# Patient Record
Sex: Female | Born: 1980 | Hispanic: Yes | Marital: Married | Smoking: Current some day smoker
Health system: Southern US, Community
[De-identification: ages and names within clinical notes are randomized; demographics above are authoritative.]

## PROBLEM LIST (undated history)

## (undated) DIAGNOSIS — Z3183 Encounter for assisted reproductive fertility procedure cycle: Secondary | ICD-10-CM

## (undated) DIAGNOSIS — N809 Endometriosis, unspecified: Secondary | ICD-10-CM

## (undated) DIAGNOSIS — N6310 Unspecified lump in the right breast, unspecified quadrant: Secondary | ICD-10-CM

---

## 2018-09-21 ENCOUNTER — Emergency Department (HOSPITAL_COMMUNITY): Payer: Self-pay

## 2018-09-21 ENCOUNTER — Other Ambulatory Visit: Payer: Self-pay

## 2018-09-21 ENCOUNTER — Emergency Department (HOSPITAL_COMMUNITY)
Admission: EM | Admit: 2018-09-21 | Discharge: 2018-09-21 | Disposition: A | Discharge status: Home / Self Care | Payer: Self-pay | Attending: Emergency Medicine | Admitting: Emergency Medicine

## 2018-09-21 DIAGNOSIS — R110 Nausea: Secondary | ICD-10-CM | POA: Insufficient documentation

## 2018-09-21 DIAGNOSIS — Z3A01 Less than 8 weeks gestation of pregnancy: Secondary | ICD-10-CM | POA: Insufficient documentation

## 2018-09-21 DIAGNOSIS — R1030 Lower abdominal pain, unspecified: Secondary | ICD-10-CM | POA: Insufficient documentation

## 2018-09-21 DIAGNOSIS — O209 Hemorrhage in early pregnancy, unspecified: Secondary | ICD-10-CM

## 2018-09-21 DIAGNOSIS — O468X1 Other antepartum hemorrhage, first trimester: Secondary | ICD-10-CM | POA: Insufficient documentation

## 2018-09-21 LAB — CBC WITH DIFFERENTIAL/PLATELET
Abs Immature Granulocytes: 0.04 10*3/uL (ref 0.00–0.07)
BASOS ABS: 0.1 10*3/uL (ref 0.0–0.1)
Basophils Relative: 1 %
Eosinophils Absolute: 0.2 10*3/uL (ref 0.0–0.5)
Eosinophils Relative: 2 %
HCT: 38.3 % (ref 36.0–46.0)
HEMOGLOBIN: 12 g/dL (ref 12.0–15.0)
IMMATURE GRANULOCYTES: 0 %
LYMPHS ABS: 2.6 10*3/uL (ref 0.7–4.0)
LYMPHS PCT: 28 %
MCH: 27.2 pg (ref 26.0–34.0)
MCHC: 31.3 g/dL (ref 30.0–36.0)
MCV: 86.8 fL (ref 80.0–100.0)
Monocytes Absolute: 0.6 10*3/uL (ref 0.1–1.0)
Monocytes Relative: 6 %
NEUTROS PCT: 63 %
NRBC: 0 % (ref 0.0–0.2)
Neutro Abs: 5.9 10*3/uL (ref 1.7–7.7)
Platelets: 323 10*3/uL (ref 150–400)
RBC: 4.41 MIL/uL (ref 3.87–5.11)
RDW: 12.6 % (ref 11.5–15.5)
WBC: 9.4 10*3/uL (ref 4.0–10.5)

## 2018-09-21 LAB — ABO/RH: ABO/RH(D): B POS

## 2018-09-21 LAB — HCG, QUANTITATIVE, PREGNANCY: HCG, BETA CHAIN, QUANT, S: 4 m[IU]/mL (ref <5)

## 2018-09-21 LAB — WET PREP, GENITAL
Clue Cells Wet Prep HPF POC: NONE SEEN
Sperm: NONE SEEN
TRICH WET PREP: NONE SEEN
Yeast Wet Prep HPF POC: NONE SEEN

## 2018-09-21 NOTE — ED Provider Notes (Signed)
[facility]-EMERGENCY DEPT Provider Note   CSN: [id number] Arrival date & time: 09/21/18  1924     History   Chief Complaint Chief Complaint  Patient presents with  . Vaginal Bleeding  . Abdominal Pain    HPI Judith Levine Synetta Fail is a 37 y.o. female G1 who present to the ED with vaginal bleeding in early pregnancy. LMP 08/04/18. Patient reports lying in bed this afternoon when the bleeding started. Last sexual intercourse one week ago. Patient had positive pregnancy test at her PCP and has not started prenatal care yet.  The history is provided by the patient. A language interpreter was used.  Vaginal Bleeding  Primary symptoms include vaginal bleeding.  Primary symptoms include no dysuria. There has been no fever. This is a new problem. The symptoms occur during urination. She is pregnant. Associated symptoms include abdominal pain and nausea. Pertinent negatives include no vomiting and no frequency. She has tried nothing for the symptoms.  Abdominal Pain   Associated symptoms include nausea. Pertinent negatives include fever, vomiting, dysuria, frequency and headaches.    No past medical history on file.  There are no active problems to display for this patient.    OB History   None      Home Medications    Prior to Admission medications   Not on File    Family History No family history on file.  Social History Social History   Tobacco Use  . Smoking status: Not on file  Substance Use Topics  . Alcohol use: Not on file  . Drug use: Not on file     Allergies   Patient has no allergy information on record.   Review of Systems Review of Systems  Constitutional: Negative for chills and fever.  HENT: Negative.   Eyes: Negative for visual disturbance.  Gastrointestinal: Positive for abdominal pain and nausea. Negative for vomiting.  Genitourinary: Positive for vaginal bleeding. Negative for dysuria and frequency.  Musculoskeletal:  Negative for back pain.  Skin: Negative for rash.  Neurological: Negative for headaches.     Physical Exam Updated Vital Signs BP 136/70 (BP Location: Right Arm)   Pulse 79   Temp (!) 97.5 F (36.4 C) (Oral)   Resp 19   Ht 5\' 3"  (1.6 m)   Wt 49.9 kg   LMP 08/04/2018   SpO2 99%   BMI 19.49 kg/m   Physical Exam  Constitutional: She appears well-developed and well-nourished. No distress.  HENT:  Head: Normocephalic.  Eyes: Conjunctivae and EOM are normal.  Neck: Neck supple.  Cardiovascular: Normal rate.  Pulmonary/Chest: Effort normal.  Abdominal: Soft. There is tenderness (mild lower abdomen).  Genitourinary:  Genitourinary Comments: External genitalia without lesions. Small blood vaginal vault. Cervix long, closed. No CMT, no adnexal tenderness or mass palpated. Uterus without enlargement.   Musculoskeletal: Normal range of motion.  Neurological: She is alert.  Skin: Skin is warm and dry.  Psychiatric: She has a normal mood and affect. Her behavior is normal.  Nursing note and vitals reviewed.    ED Treatments / Results  Labs (all labs ordered are listed, but only abnormal results are displayed) Labs Reviewed  WET PREP, GENITAL - Abnormal; Notable for the following components:      Result Value   WBC, Wet Prep HPF POC MODERATE (*)    All other components within normal limits  CBC WITH DIFFERENTIAL/PLATELET  HCG, QUANTITATIVE, PREGNANCY  ABO/RH  GC/CHLAMYDIA PROBE AMP (Aline) NOT AT [facility]  Radiology US Ob Less Than 14 Weeks With Ob Transvaginal  Result Date: 09/21/2018 CLINICAL DATA:  Pregnant patient with vaginal spotting and abdominal pain. Last menstrual period 08/04/2018. Beta HCG in progress. Clinical notes state patient reports 09/17/2018 diagnosed pregnancy of undetermined location. EXAM: OBSTETRIC <14 WK [place] AND TRANSVAGINAL OB US TECHNIQUE: Both transabdominal and transvaginal ultrasound examinations were performed for complete evaluation of the  gestation as well as the maternal uterus, adnexal regions, and pelvic cul-de-sac. Transvaginal technique was performed to assess early pregnancy. COMPARISON:  None. FINDINGS: Intrauterine gestational sac: None Yolk sac:  Not Visualized. Embryo:  Not Visualized. Cardiac Activity: Not Visualized. Subchorionic hemorrhage:  Not applicable. Maternal uterus/adnexae: The uterus is anteverted. Endometrium measures approximately 5 mm. No fluid in the endometrial canal. The right ovary measures 3.8 x 2.0 x 2.5 cm and contains a 2.4 x 1.5 x 2.1 cm ovoid heterogeneous complex region without vascularity. No ring of fire or the left ovary is normal. Small volume of simple appearing free fluid in the pelvis and right adnexa. IMPRESSION: 1. No intrauterine pregnancy. 2. Ovoid heterogeneous 2.4 cm avascular structure in the right ovary is nonspecific, and may represent a hemorrhagic cyst. Right ovarian ectopic is also considered given free fluid in the adnexa. Free fluid appears simple rather than complex or hemorrhagic. Recommend correlation with beta HCG. Trending of beta HCG and follow-up ultrasound in 7-10 days recommended. Correlation with prior ultrasound (presumably obtained elsewhere) may be of value. Electronically Signed   By: Narda Rutherford M.D.   On: 09/21/2018 22:01    Procedures Procedures (including critical care time)  Medications Ordered in ED Medications - No data to display   Initial Impression / Assessment and Plan / ED Course  I have reviewed the triage vital signs and the nursing notes. 37 y.o. female here with vaginal bleeding after positive pregnancy test at a clinic one week ago and LMP 08/04/18. Patient stable for d/c without hemorrhage, empty uterus, no fever or signs of infection. Discussed with the patient possible miscarriage vs false positive pregnancy test last week at clinic, vs chemical pregnancy and need for f/u at [facility] Out patient clinic. Patient voices understanding and agrees with  plan. I discussed this case with the CNM at [facility] and with Dr. Rush Landmark.  Final Clinical Impressions(s) / ED Diagnoses   Final diagnoses:  Vaginal bleeding in pregnancy, first trimester    ED Discharge Orders    None       Kerrie [place], NP 09/21/18 2304    Tegeler, Canary Brim, MD 09/21/18 [phone]

## 2018-09-21 NOTE — ED Triage Notes (Signed)
Pt reports that 09/17/2018 pt was dx with pregnancy undetermined gestation; LMP 08/04/2018. Pt states that sudden onset of pain 1700 and spotting. Pt denies N/V.

## 2018-09-21 NOTE — Discharge Instructions (Addendum)
Follow up at [facility] for any problems.

## 2018-09-23 ENCOUNTER — Telehealth: Payer: Self-pay | Admitting: *Deleted

## 2018-09-23 LAB — GC/CHLAMYDIA PROBE AMP (~~LOC~~) NOT AT ARMC
Chlamydia: NEGATIVE
NEISSERIA GONORRHEA: NEGATIVE

## 2018-09-23 NOTE — Telephone Encounter (Signed)
A female identifying himself as interpreter left message stating patient has miscarriage Saturday night and wants to know what next steps are because she is still bleeding .  I called patient with Interpeter Nile Riggs and she reports she is still bleeding like a period, denies pain , that she is tired. States is first miscarriage and was told should be seen in our office.  I explained I will have front office call her with appointment . In the meantime if she has severe pain or heavy bleeding or feeling dizzy, lightheaded come to [facility] , not [facility] ED She vocies understanding.

## 2018-09-25 ENCOUNTER — Telehealth: Payer: Self-pay | Admitting: Family Medicine

## 2018-09-25 NOTE — Telephone Encounter (Signed)
I tried to reach the patient via telephone. However, I was not able to get the call to go through. I will send a reminder letter.

## 2018-10-11 ENCOUNTER — Encounter: Payer: Self-pay | Admitting: Student

## 2019-09-26 ENCOUNTER — Other Ambulatory Visit: Payer: Self-pay

## 2019-09-26 DIAGNOSIS — Z20822 Contact with and (suspected) exposure to covid-19: Secondary | ICD-10-CM

## 2019-09-27 LAB — NOVEL CORONAVIRUS, NAA: SARS-CoV-2, NAA: NOT DETECTED

## 2020-01-06 ENCOUNTER — Other Ambulatory Visit: Payer: Self-pay | Admitting: Obstetrics and Gynecology

## 2020-01-06 DIAGNOSIS — N6310 Unspecified lump in the right breast, unspecified quadrant: Secondary | ICD-10-CM

## 2020-01-16 ENCOUNTER — Ambulatory Visit
Admission: RE | Admit: 2020-01-16 | Discharge: 2020-01-16 | Disposition: A | Discharge status: Home / Self Care | Payer: BC Managed Care – PPO | Source: Ambulatory Visit | Attending: Obstetrics and Gynecology | Admitting: Obstetrics and Gynecology

## 2020-01-16 ENCOUNTER — Other Ambulatory Visit: Payer: Self-pay

## 2020-01-16 DIAGNOSIS — N6310 Unspecified lump in the right breast, unspecified quadrant: Secondary | ICD-10-CM

## 2020-08-17 ENCOUNTER — Emergency Department (HOSPITAL_BASED_OUTPATIENT_CLINIC_OR_DEPARTMENT_OTHER)
Admission: EM | Admit: 2020-08-17 | Discharge: 2020-08-17 | Disposition: A | Discharge status: Home / Self Care | Payer: BC Managed Care – PPO | Attending: Emergency Medicine | Admitting: Emergency Medicine

## 2020-08-17 ENCOUNTER — Other Ambulatory Visit: Payer: Self-pay

## 2020-08-17 ENCOUNTER — Emergency Department (HOSPITAL_BASED_OUTPATIENT_CLINIC_OR_DEPARTMENT_OTHER): Payer: BC Managed Care – PPO

## 2020-08-17 ENCOUNTER — Encounter (HOSPITAL_BASED_OUTPATIENT_CLINIC_OR_DEPARTMENT_OTHER): Payer: Self-pay

## 2020-08-17 DIAGNOSIS — R1010 Upper abdominal pain, unspecified: Secondary | ICD-10-CM | POA: Insufficient documentation

## 2020-08-17 DIAGNOSIS — Z79899 Other long term (current) drug therapy: Secondary | ICD-10-CM | POA: Insufficient documentation

## 2020-08-17 DIAGNOSIS — R10816 Epigastric abdominal tenderness: Secondary | ICD-10-CM | POA: Insufficient documentation

## 2020-08-17 DIAGNOSIS — R748 Abnormal levels of other serum enzymes: Secondary | ICD-10-CM | POA: Diagnosis not present

## 2020-08-17 DIAGNOSIS — F17200 Nicotine dependence, unspecified, uncomplicated: Secondary | ICD-10-CM | POA: Diagnosis not present

## 2020-08-17 LAB — URINALYSIS, ROUTINE W REFLEX MICROSCOPIC
Bilirubin Urine: NEGATIVE
Glucose, UA: NEGATIVE mg/dL
Hgb urine dipstick: NEGATIVE
Ketones, ur: NEGATIVE mg/dL
Leukocytes,Ua: NEGATIVE
Nitrite: NEGATIVE
Protein, ur: NEGATIVE mg/dL
Specific Gravity, Urine: <1.005 — ABNORMAL LOW (ref 1.005–1.030)
pH: 6.5 (ref 5.0–8.0)

## 2020-08-17 LAB — CBC
HCT: 35.3 % — ABNORMAL LOW (ref 36.0–46.0)
Hemoglobin: 11.5 g/dL — ABNORMAL LOW (ref 12.0–15.0)
MCH: 26.9 pg (ref 26.0–34.0)
MCHC: 32.6 g/dL (ref 30.0–36.0)
MCV: 82.7 fL (ref 80.0–100.0)
Platelets: 330 10*3/uL (ref 150–400)
RBC: 4.27 MIL/uL (ref 3.87–5.11)
RDW: 12.1 % (ref 11.5–15.5)
WBC: 7.2 10*3/uL (ref 4.0–10.5)
nRBC: 0 % (ref 0.0–0.2)

## 2020-08-17 LAB — COMPREHENSIVE METABOLIC PANEL
ALT: 19 U/L (ref 0–44)
AST: 25 U/L (ref 15–41)
Albumin: 4.4 g/dL (ref 3.5–5.0)
Alkaline Phosphatase: 48 U/L (ref 38–126)
Anion gap: 11 (ref 5–15)
BUN: 10 mg/dL (ref 6–20)
CO2: 23 mmol/L (ref 22–32)
Calcium: 9 mg/dL (ref 8.9–10.3)
Chloride: 101 mmol/L (ref 98–111)
Creatinine, Ser: 0.53 mg/dL (ref 0.44–1.00)
GFR calc Af Amer: >60 mL/min (ref >60)
GFR calc non Af Amer: >60 mL/min (ref >60)
Glucose, Bld: 91 mg/dL (ref 70–99)
Potassium: 3.7 mmol/L (ref 3.5–5.1)
Sodium: 135 mmol/L (ref 135–145)
Total Bilirubin: 1.8 mg/dL — ABNORMAL HIGH (ref 0.3–1.2)
Total Protein: 7.2 g/dL (ref 6.5–8.1)

## 2020-08-17 LAB — PREGNANCY, URINE: Preg Test, Ur: NEGATIVE

## 2020-08-17 LAB — LIPASE, BLOOD: Lipase: 55 U/L — ABNORMAL HIGH (ref 11–51)

## 2020-08-17 MED ORDER — ONDANSETRON 4 MG PO TBDP: 4.0000 mg | ORAL_TABLET | ORAL | 0 refills | Status: DC | PRN

## 2020-08-17 MED ORDER — LACTATED RINGERS IV BOLUS
1000.0000 mL | Freq: Once | INTRAVENOUS | Status: AC
  Administered 2020-08-17: 1000 mL via INTRAVENOUS

## 2020-08-17 MED ORDER — IOHEXOL 300 MG/ML  SOLN
100.0000 mL | Freq: Once | INTRAMUSCULAR | Status: AC
  Administered 2020-08-17: 100 mL via INTRAVENOUS

## 2020-08-17 MED ORDER — PANTOPRAZOLE SODIUM 20 MG PO TBEC: 20.0000 mg | DELAYED_RELEASE_TABLET | Freq: Every day | ORAL | 0 refills | Status: DC

## 2020-08-17 MED ORDER — PANTOPRAZOLE SODIUM 40 MG IV SOLR
40.0000 mg | Freq: Once | INTRAVENOUS | Status: AC
  Administered 2020-08-17: 40 mg via INTRAVENOUS
  Filled 2020-08-17: qty 40

## 2020-08-17 NOTE — Discharge Instructions (Signed)
1.  Stop taking aspirin for the next several days.  Discussed this with your fertility provider.  You may have gastritis or an ulcer.  Aspirin can make these conditions worse.  However, the medications you are taking for fertility may increase your risk of developing a blood clot.  The risk and benefit of these medications needs to be discussed with your fertility provider. 2.  You have a very mild elevation in a lab value called lipase.  This may indicate pancreatitis.  The elevation however is very mild but needs to be reassessed.  You need to eat a very low-fat diet.  Eat small amounts of food and mostly liquids.  No spicy food for now. 3.  You should have a family doctor to help you further evaluate problems with abdominal pain.  You may need referral for an upper endoscopy if you continue to have pain.  An endoscopy is a test with a lighted scope that is inserted into the mouth through the esophagus to the stomach.  This allows the physician to see if there are ulcers or severe inflammation of the stomach lining.  At this time, you will start treating this condition by taking Protonix daily as prescribed and eating a mild, low-fat diet. 4.  Return to the emergency department immediately if your pain is worsening, you develop vomiting or vomiting with blood, severe weakness or other concerning symptoms.

## 2020-08-17 NOTE — ED Triage Notes (Signed)
Per husband/interpreter pt with abd pain, nausea, dizziness started 7am-NAD-steady gait

## 2020-08-17 NOTE — ED Provider Notes (Signed)
[facility] EMERGENCY DEPARTMENT Provider Note   CSN: [id number] Arrival date & time: 08/17/20  1316     History Chief Complaint  Patient presents with  . Abdominal Pain    Judith Levine Synetta Fail is a 39 y.o. female.  HPI Patient reports she started to get upper abdominal discomfort yesterday.  It started to feel like her abdomen was getting bloated and painful in the upper abdomen.  Today symptoms worsened.  She did eat some breakfast and did not vomit.  She reports she did feel very nauseated.  She tried some Tums at work but pain became much more intense and radiated into her back.  Was made worse by lifting.  He did not develop any diarrhea.  She has not been having any problems with pain or burning with urination.  Patient is under going fertility treatment.  She takes estradiol and Femara as well as prenatal vitamin.  She has not been having any vaginal bleeding.  She has not been having lower abdominal pain.  She has not been having pain or burning with urination.  She denies history of similar pain. Patient occasionally drinks alcohol.  Last alcohol was on Saturday when she had a couple of beers.    History reviewed. No pertinent past medical history.  There are no problems to display for this patient.   History reviewed. No pertinent surgical history.   OB History   No obstetric history on file.     Family History  Problem Relation Age of Onset  . Breast cancer Maternal Grandmother     Social History   Tobacco Use  . Smoking status: Current Some Day Smoker  . Smokeless tobacco: Never Used  Substance Use Topics  . Alcohol use: Yes    Comment: rare  . Drug use: Never    Home Medications Prior to Admission medications   Medication Sig Start Date End Date Taking? Authorizing Provider  DOTTI 0.1 MG/24HR patch SMARTSIG:1 Patch(s) T-DERMAL Every 3 Days 05/14/20   [provider]  letrozole (FEMARA) 2.5 MG tablet Take 2.5 mg by mouth daily.  07/15/20   [provider]  LUPRON DEPOT, 50-MONTH, 3.75 MG injection Inject into the muscle. 07/15/20   [provider]  ondansetron (ZOFRAN ODT) 4 MG disintegrating tablet Take 1 tablet (4 mg total) by mouth every 4 (four) hours as needed for nausea or vomiting. 08/17/20   Arby Barrette, MD  pantoprazole (PROTONIX) 20 MG tablet Take 1 tablet (20 mg total) by mouth daily. 08/17/20   Arby Barrette, MD    Allergies    Shellfish allergy  Review of Systems   Review of Systems 10 systems reviewed and negative except as per HPI Physical Exam Updated Vital Signs BP 121/80 (BP Location: Left Arm)   Pulse (!) 58   Temp 97.6 F (36.4 C) (Oral)   Resp 20   Ht 5\' 2"  (1.575 m)   Wt 50.8 kg   SpO2 99%   BMI 20.49 kg/m   Physical Exam Constitutional:      Appearance: She is well-developed.  HENT:     Head: Normocephalic and atraumatic.     Mouth/Throat:     Pharynx: Oropharynx is clear.  Eyes:     Extraocular Movements: Extraocular movements intact.     Conjunctiva/sclera: Conjunctivae normal.  Cardiovascular:     Rate and Rhythm: Normal rate and regular rhythm.     Heart sounds: Normal heart sounds.  Pulmonary:     Effort: Pulmonary  effort is normal.     Breath sounds: Normal breath sounds.  Abdominal:     General: Bowel sounds are normal. There is no distension.     Palpations: Abdomen is soft.     Tenderness: There is abdominal tenderness.     Comments: Central abdomen and epigastrium diffusely tender without guarding.  Musculoskeletal:        General: Normal range of motion.     Cervical back: Neck supple.     Right lower leg: No edema.     Left lower leg: No edema.  Skin:    General: Skin is warm and dry.  Neurological:     Mental Status: She is alert and oriented to person, place, and time.     GCS: GCS eye subscore is 4. GCS verbal subscore is 5. GCS motor subscore is 6.     Coordination: Coordination normal.  Psychiatric:        Mood and Affect:  Mood normal.     ED Results / Procedures / Treatments   Labs (all labs ordered are listed, but only abnormal results are displayed) Labs Reviewed  LIPASE, BLOOD - Abnormal; Notable for the following components:      Result Value   Lipase 55 (*)    All other components within normal limits  COMPREHENSIVE METABOLIC PANEL - Abnormal; Notable for the following components:   Total Bilirubin 1.8 (*)    All other components within normal limits  CBC - Abnormal; Notable for the following components:   Hemoglobin 11.5 (*)    HCT 35.3 (*)    All other components within normal limits  URINALYSIS, ROUTINE W REFLEX MICROSCOPIC - Abnormal; Notable for the following components:   Specific Gravity, Urine <1.005 (*)    All other components within normal limits  PREGNANCY, URINE    EKG None  Radiology CT Abdomen Pelvis W Contrast  Result Date: 08/17/2020 CLINICAL DATA:  Abdominal distension, nausea EXAM: CT ABDOMEN AND PELVIS WITH CONTRAST TECHNIQUE: Multidetector CT imaging of the abdomen and pelvis was performed using the standard protocol following bolus administration of intravenous contrast. CONTRAST:  OMNIPAQUE IOHEXOL 300 MG/ML  SOLN COMPARISON:  None. FINDINGS: Lower chest: No acute abnormality. Hepatobiliary: No focal liver abnormality is seen. No gallstones, gallbladder wall thickening, or biliary dilatation. Pancreas: Unremarkable Spleen: Unremarkable Adrenals/Urinary Tract: The adrenal glands are unremarkable. Simple cortical cyst noted within the upper pole the right kidney. The kidneys are otherwise unremarkable. The bladder is unremarkable. Stomach/Bowel: The stomach, small bowel, and large bowel are unremarkable. No evidence of obstruction or focal inflammation. No free intraperitoneal gas or fluid. The appendix is not clearly identified, however, no secondary signs of acute appendicitis are seen within the right lower quadrant of the abdomen. Vascular/Lymphatic: No significant  vascular findings are present. No enlarged abdominal or pelvic lymph nodes. Reproductive: Uterus and bilateral adnexa are unremarkable. Other: Rectum unremarkable. Musculoskeletal: No acute or significant osseous findings. IMPRESSION: No acute intra-abdominal or intrapelvic pathology. Electronically Signed   By: Helyn Numbers MD   On: 08/17/2020 18:51    Procedures Procedures (including critical care time)  Medications Ordered in ED Medications  pantoprazole (PROTONIX) injection 40 mg (40 mg Intravenous Given 08/17/20 1749)  lactated ringers bolus 1,000 mL (1,000 mLs Intravenous New Bag/Given 08/17/20 1748)  iohexol (OMNIPAQUE) 300 MG/ML solution 100 mL (100 mLs Intravenous Contrast Given 08/17/20 1837)    ED Course  I have reviewed the triage vital signs and the nursing notes.  Pertinent labs &  imaging results that were available during my care of the patient were reviewed by me and considered in my medical decision making (see chart for details).    MDM Rules/Calculators/A&P                         Patient feels significantly improved after Protonix and fluids.  CT does not identify any acute findings.  Lipase has very mild elevation.  I have reviewed plans for very low-fat diet and mostly liquids with close follow-up and recheck of lipase.  Return precautions have been reviewed.  Patient has been taking daily baby aspirin as part of her IVF medication regimen.  At this time, I have concern for gastritis or possible ulcer.  Patient has not had any hematemesis.  She is not significantly anemic.  No sign of any aggressive GI bleeding.  However, at this time I will have her hold aspirin at least for the next several days until she reviews this with her IVF provider.  Patient will be started on Protonix daily.  Return precautions reviewed and advice given regarding finding a PCP for further referrals to gastroenterology if upper endoscopy is indicated. Final Clinical Impression(s) / ED  Diagnoses Final diagnoses:  Pain of upper abdomen  Elevated lipase    Rx / DC Orders ED Discharge Orders         Ordered    pantoprazole (PROTONIX) 20 MG tablet  Daily        08/17/20 1918    ondansetron (ZOFRAN ODT) 4 MG disintegrating tablet  Every 4 hours PRN        08/17/20 1918           Arby Barrette, MD 08/17/20 1926

## 2020-08-17 NOTE — ED Notes (Signed)
Pt ambulatory with steady gait to room, changing into gown. Husband at bedside

## 2020-12-01 LAB — OB RESULTS CONSOLE HEPATITIS B SURFACE ANTIGEN: Hepatitis B Surface Ag: NEGATIVE

## 2020-12-01 LAB — OB RESULTS CONSOLE RPR: RPR: NONREACTIVE

## 2020-12-01 LAB — OB RESULTS CONSOLE ABO/RH: RH Type: POSITIVE

## 2020-12-01 LAB — OB RESULTS CONSOLE GC/CHLAMYDIA
Chlamydia: NEGATIVE
Gonorrhea: NEGATIVE

## 2020-12-01 LAB — OB RESULTS CONSOLE ANTIBODY SCREEN: Antibody Screen: NEGATIVE

## 2020-12-01 LAB — OB RESULTS CONSOLE RUBELLA ANTIBODY, IGM: Rubella: IMMUNE

## 2020-12-01 LAB — OB RESULTS CONSOLE HIV ANTIBODY (ROUTINE TESTING): HIV: NONREACTIVE

## 2020-12-04 NOTE — L&D Delivery Note (Signed)
PROCEDURE DATE: 05/12/2021   PREOPERATIVE DIAGNOSIS: AMA, suspected IUGR, IVF pregnancy, failure to progress, fetal nonreassuring status   POSTOPERATIVE DIAGNOSIS: The same   PROCEDURE:    Primar Low Transverse Cesarean Section   SURGEON:  Dr. Nilda Simmer MD   INDICATIONS: This is a 39yo G1P0 at 33 wga requiring cesarean section secondary to failure to progress, nonreassuring fetal status.   Patient underwent prolonged induction, required 5 doses of cytotec, foley balloon, AROM, titrated pitocin.   Repetitive late decels were noted.  After 40+ hours of interventions she did not preogress beyond 4 cm dilation and with persistent thick cervix. Decision made to proceed with LTCS. The risks of cesarean section discussed with the patient included but were not limited to: bleeding which may require transfusion or reoperation; infection which may require antibiotics; injury to bowel, bladder, ureters or other surrounding organs; injury to the fetus; need for additional procedures including hysterectomy in the event of a life-threatening hemorrhage; placental abnormalities wth subsequent pregnancies, incisional problems, thromboembolic phenomenon and other postoperative/anesthesia complications. The patient agreed with the proposed plan, giving informed consent for the procedure.     FINDINGS:  Viable female infant in vertex presentation, APGARs5, 9,  Weight pending, Amniotic fluid clear,  Intact placenta, three vessel cord.  Grossly normal uterus .   ANESTHESIA:    Epidural ESTIMATED BLOOD LOSS: 650 mL  SPECIMENS: Placenta for pathology (IUGR) COMPLICATIONS: None immediate   PROCEDURE IN DETAIL:  The patient received intravenous antibiotics (2g Ancef) and had sequential compression devices applied to her lower extremities while in the preoperative area.  She was then taken to the operating room where epidural anesthesia was dosed up to surgical level and was found to be adequate. She was then placed  in a dorsal supine position with a leftward tilt, and prepped and draped in a sterile manner.  A foley catheter was placed into her bladder and attached to constant gravity.  After an adequate timeout was performed, a Pfannenstiel skin incision was made with scalpel and carried through to the underlying layer of fascia. The fascia was incised in the midline and this incision was extended bilaterally using the Mayo scissors. Kocher clamps were applied to the superior aspect of the fascial incision and the underlying rectus muscles were dissected off bluntly. A similar process was carried out on the inferior aspect of the facial incision. The rectus muscles were separated in the midline bluntly and the peritoneum was entered bluntly.  A bladder flap was created sharply and developed bluntly. A transverse hysterotomy was made with a scalpel and extended bilaterally bluntly. The bladder blade was then removed. Some difficult was noted with delivery, vacuum assistance was done x2 with progress, followed byextension of hysterotomy with bandage scissors and adjustment of fundal pressure. The infant was successfully delivered, and cord was clamped and cut and infant was handed over to awaiting neonatology team. Uterine massage was then administered and the placenta delivered intact with three-vessel cord. Cord gases were taken. The uterus was cleared of clot and debris.  The hysterotomy was closed with 0 vicryl.  A second imbricating suture of 0-vicryl was used to reinforce the incision and aid in hemostasis.The fascia was closed with 0-Vicryl in a running fashion with good restoration of anatomy.  The subcutaneus tissue was irrigated and was reapproximated using three interrupted plain gut stitches.  The skin was closed with 4-0 Vicryl in a subcuticular fashion.  All surgical site and was hemostatic at end of procedure) without any  further bleeding on exam.    Pt tolerated the procedure well. All sponge/lap/needle  counts were correct  X 2. Pt taken to recovery room in stable condition.   Nilda Simmer MD

## 2021-04-20 LAB — OB RESULTS CONSOLE GBS: GBS: NEGATIVE

## 2021-05-05 ENCOUNTER — Inpatient Hospital Stay (HOSPITAL_COMMUNITY)
Admission: AD | Admit: 2021-05-05 | Payer: BC Managed Care – PPO | Source: Home / Self Care | Admitting: Obstetrics & Gynecology

## 2021-05-09 ENCOUNTER — Encounter (HOSPITAL_COMMUNITY): Payer: Self-pay | Admitting: *Deleted

## 2021-05-09 ENCOUNTER — Telehealth (HOSPITAL_COMMUNITY): Payer: Self-pay | Admitting: *Deleted

## 2021-05-09 NOTE — Telephone Encounter (Signed)
[id number] interpreter number  Preadmission screen

## 2021-05-10 ENCOUNTER — Other Ambulatory Visit (HOSPITAL_COMMUNITY)
Admission: RE | Admit: 2021-05-10 | Discharge: 2021-05-10 | Disposition: A | Discharge status: Home / Self Care | Payer: BC Managed Care – PPO | Source: Ambulatory Visit | Attending: Obstetrics and Gynecology | Admitting: Obstetrics and Gynecology

## 2021-05-10 ENCOUNTER — Other Ambulatory Visit: Payer: Self-pay

## 2021-05-10 DIAGNOSIS — Z20822 Contact with and (suspected) exposure to covid-19: Secondary | ICD-10-CM | POA: Insufficient documentation

## 2021-05-10 DIAGNOSIS — Z01812 Encounter for preprocedural laboratory examination: Secondary | ICD-10-CM | POA: Insufficient documentation

## 2021-05-10 LAB — SARS CORONAVIRUS 2 (TAT 6-24 HRS): SARS Coronavirus 2: NEGATIVE

## 2021-05-10 NOTE — H&P (Signed)
Judith Levine is a 40 y.o. female presenting for scheduled IOL. Pregnancy c/b suspected IUGR, AMA, and IVF. 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 Last [place] with EFW in 13%ile (5#14) at 37 wga. This is an IVF pregnancy and a PGD tested euploid female embryo. She is Spanish speaking. OB History    Gravida  2   Para      Term      Preterm      AB  1   Living        SAB  1   IAB      Ectopic      Multiple      Live Births             No past medical history on file. No past surgical history on file. Family History: family history includes Breast cancer in her maternal grandmother. Social History:  reports that she has been smoking. She has never used smokeless tobacco. She reports current alcohol use. She reports that she does not use drugs.     Maternal Diabetes: No Genetic Screening: Normal Maternal Ultrasounds/Referrals: Normal and IUGR Fetal Ultrasounds or other Referrals:  None Maternal Substance Abuse:  No Significant Maternal Medications:  None Significant Maternal Lab Results:  None Other Comments:  None  Review of Systems History   There were no vitals taken for this visit. Exam Physical Exam  (from office) NAD, A&O NWOB Abd soft, nondistended, gravid  Prenatal labs: ABO, Rh: B/Positive/-- (12/29 0000) Antibody: Negative (12/29 0000) Rubella: Immune (12/29 0000) RPR: Nonreactive (12/29 0000)  HBsAg: Negative (12/29 0000)  HIV: Non-reactive (12/29 0000)  GBS:   Negative  Assessment/Plan: 39 yo G2P0010 @ 39.0 wga presenting for IOL s/s suspected IUGR and AMA. Cervix unfavorable. Plan for cytotec followed by pitocin/AROM when more favorable.  GBS negative.    Ranae Pila 05/10/2021, 12:59 PM

## 2021-05-11 ENCOUNTER — Inpatient Hospital Stay (HOSPITAL_COMMUNITY): Payer: BC Managed Care – PPO

## 2021-05-11 ENCOUNTER — Other Ambulatory Visit: Payer: Self-pay

## 2021-05-11 ENCOUNTER — Encounter (HOSPITAL_COMMUNITY): Payer: Self-pay | Admitting: Obstetrics and Gynecology

## 2021-05-11 ENCOUNTER — Inpatient Hospital Stay (HOSPITAL_COMMUNITY)
Admission: AD | Admit: 2021-05-11 | Discharge: 2021-05-15 | DRG: 788 | Disposition: A | Discharge status: Home / Self Care | Payer: BC Managed Care – PPO | Attending: Obstetrics and Gynecology | Admitting: Obstetrics and Gynecology

## 2021-05-11 DIAGNOSIS — Z3A39 39 weeks gestation of pregnancy: Secondary | ICD-10-CM | POA: Diagnosis not present

## 2021-05-11 DIAGNOSIS — O99334 Smoking (tobacco) complicating childbirth: Secondary | ICD-10-CM | POA: Diagnosis present

## 2021-05-11 DIAGNOSIS — O365930 Maternal care for other known or suspected poor fetal growth, third trimester, not applicable or unspecified: Principal | ICD-10-CM | POA: Diagnosis present

## 2021-05-11 DIAGNOSIS — Z3490 Encounter for supervision of normal pregnancy, unspecified, unspecified trimester: Secondary | ICD-10-CM

## 2021-05-11 DIAGNOSIS — Z20822 Contact with and (suspected) exposure to covid-19: Secondary | ICD-10-CM | POA: Diagnosis present

## 2021-05-11 HISTORY — DX: Unspecified lump in the right breast, unspecified quadrant: N63.10

## 2021-05-11 HISTORY — DX: Encounter for assisted reproductive fertility procedure cycle: Z31.83

## 2021-05-11 HISTORY — DX: Endometriosis, unspecified: N80.9

## 2021-05-11 LAB — CBC
HCT: 37.2 % (ref 36.0–46.0)
Hemoglobin: 12.3 g/dL (ref 12.0–15.0)
MCH: 27.4 pg (ref 26.0–34.0)
MCHC: 33.1 g/dL (ref 30.0–36.0)
MCV: 82.9 fL (ref 80.0–100.0)
Platelets: 316 10*3/uL (ref 150–400)
RBC: 4.49 MIL/uL (ref 3.87–5.11)
RDW: 12.2 % (ref 11.5–15.5)
WBC: 14.4 10*3/uL — ABNORMAL HIGH (ref 4.0–10.5)
nRBC: 0 % (ref 0.0–0.2)

## 2021-05-11 LAB — TYPE AND SCREEN
ABO/RH(D): B POS
Antibody Screen: NEGATIVE

## 2021-05-11 LAB — RPR: RPR Ser Ql: NONREACTIVE

## 2021-05-11 MED ORDER — FENTANYL-BUPIVACAINE-NACL 0.5-0.125-0.9 MG/250ML-% EP SOLN
12.0000 mL/h | EPIDURAL | Status: DC | PRN
  Administered 2021-05-12: 12 mL/h via EPIDURAL
  Filled 2021-05-11: qty 250

## 2021-05-11 MED ORDER — PHENYLEPHRINE 40 MCG/ML (10ML) SYRINGE FOR IV PUSH (FOR BLOOD PRESSURE SUPPORT): 80.0000 ug | PREFILLED_SYRINGE | INTRAVENOUS | Status: DC | PRN

## 2021-05-11 MED ORDER — OXYTOCIN-SODIUM CHLORIDE 30-0.9 UT/500ML-% IV SOLN
1.0000 m[IU]/min | INTRAVENOUS | Status: DC
  Administered 2021-05-12: 2 m[IU]/min via INTRAVENOUS
  Filled 2021-05-11: qty 500

## 2021-05-11 MED ORDER — TERBUTALINE SULFATE 1 MG/ML IJ SOLN
0.2500 mg | Freq: Once | INTRAMUSCULAR | Status: AC | PRN
  Administered 2021-05-12: 0.25 mg via SUBCUTANEOUS
  Filled 2021-05-11: qty 1

## 2021-05-11 MED ORDER — LACTATED RINGERS IV SOLN: INTRAVENOUS | Status: DC

## 2021-05-11 MED ORDER — BUTORPHANOL TARTRATE 1 MG/ML IJ SOLN
1.0000 mg | INTRAMUSCULAR | Status: DC | PRN
  Administered 2021-05-11: 1 mg via INTRAVENOUS
  Filled 2021-05-11: qty 1

## 2021-05-11 MED ORDER — LACTATED RINGERS IV SOLN: 500.0000 mL | Freq: Once | INTRAVENOUS | Status: DC

## 2021-05-11 MED ORDER — ZOLPIDEM TARTRATE 5 MG PO TABS: 5.0000 mg | ORAL_TABLET | Freq: Every evening | ORAL | Status: DC | PRN

## 2021-05-11 MED ORDER — EPHEDRINE 5 MG/ML INJ: 10.0000 mg | INTRAVENOUS | Status: DC | PRN

## 2021-05-11 MED ORDER — ONDANSETRON HCL 4 MG/2ML IJ SOLN: 4.0000 mg | Freq: Four times a day (QID) | INTRAMUSCULAR | Status: DC | PRN

## 2021-05-11 MED ORDER — OXYTOCIN-SODIUM CHLORIDE 30-0.9 UT/500ML-% IV SOLN: 2.5000 [IU]/h | INTRAVENOUS | Status: DC

## 2021-05-11 MED ORDER — HYDROXYZINE HCL 50 MG PO TABS: 50.0000 mg | ORAL_TABLET | Freq: Four times a day (QID) | ORAL | Status: DC | PRN

## 2021-05-11 MED ORDER — MISOPROSTOL 25 MCG QUARTER TABLET
25.0000 ug | ORAL_TABLET | ORAL | Status: DC | PRN
  Administered 2021-05-11 (×2): 25 ug via ORAL
  Filled 2021-05-11: qty 1

## 2021-05-11 MED ORDER — LACTATED RINGERS IV SOLN
500.0000 mL | INTRAVENOUS | Status: DC | PRN
Start: 2021-05-11 — End: 2021-05-12
  Administered 2021-05-12: 500 mL via INTRAVENOUS

## 2021-05-11 MED ORDER — OXYTOCIN BOLUS FROM INFUSION: 333.0000 mL | Freq: Once | INTRAVENOUS | Status: DC

## 2021-05-11 MED ORDER — LIDOCAINE HCL (PF) 1 % IJ SOLN: 30.0000 mL | INTRAMUSCULAR | Status: DC | PRN

## 2021-05-11 MED ORDER — OXYCODONE-ACETAMINOPHEN 5-325 MG PO TABS: 1.0000 | ORAL_TABLET | ORAL | Status: DC | PRN

## 2021-05-11 MED ORDER — OXYCODONE-ACETAMINOPHEN 5-325 MG PO TABS: 2.0000 | ORAL_TABLET | ORAL | Status: DC | PRN

## 2021-05-11 MED ORDER — DIPHENHYDRAMINE HCL 50 MG/ML IJ SOLN: 12.5000 mg | INTRAMUSCULAR | Status: DC | PRN

## 2021-05-11 MED ORDER — MISOPROSTOL 25 MCG QUARTER TABLET
25.0000 ug | ORAL_TABLET | ORAL | Status: DC | PRN
  Administered 2021-05-11 (×3): 25 ug via VAGINAL
  Filled 2021-05-11 (×4): qty 1

## 2021-05-11 MED ORDER — SOD CITRATE-CITRIC ACID 500-334 MG/5ML PO SOLN
30.0000 mL | ORAL | Status: DC | PRN
  Administered 2021-05-12: 30 mL via ORAL
  Filled 2021-05-11: qty 15

## 2021-05-11 MED ORDER — ACETAMINOPHEN 325 MG PO TABS: 650.0000 mg | ORAL_TABLET | ORAL | Status: DC | PRN

## 2021-05-11 NOTE — Progress Notes (Signed)
SVE unchanged except slightly softer. Fourth cytotec - change to buccal.  D/w pt the possibility of needing a foley balloon.  At this time she declines, and prefers continuing with cytotec.

## 2021-05-11 NOTE — Progress Notes (Signed)
No changes to H&P. Patient was able to sleep a few hours overnight. SVE 1/thick/-3 S/p Cytotec x 3 Anticipate will need more doses of cytotec and possibly even further ripening with a foley balloon. Recheck 4 hours after last cytotec and re-assess.   Rosie Fate MD

## 2021-05-11 NOTE — Progress Notes (Signed)
Still 1cm but now 50% and even softer.  S/p cytotec x 5.  Conversation with patient regarding options and she agreed to [facility] after we discussed r/b in detail. Foley balloon placed and filled with 50cc.  Will start low dose pitocin with FB in place if contractions space out.  FHT category 1 and reassuring.  1mg  of stadol given for pain control.     MD

## 2021-05-12 ENCOUNTER — Inpatient Hospital Stay (HOSPITAL_COMMUNITY): Payer: BC Managed Care – PPO | Admitting: Anesthesiology

## 2021-05-12 ENCOUNTER — Encounter (HOSPITAL_COMMUNITY): Payer: Self-pay | Admitting: Obstetrics and Gynecology

## 2021-05-12 ENCOUNTER — Ambulatory Visit (HOSPITAL_COMMUNITY): Payer: BC Managed Care – PPO

## 2021-05-12 ENCOUNTER — Encounter (HOSPITAL_COMMUNITY)
Admission: AD | Disposition: A | Discharge status: Home / Self Care | Payer: Self-pay | Source: Home / Self Care | Attending: Obstetrics and Gynecology

## 2021-05-12 ENCOUNTER — Inpatient Hospital Stay (HOSPITAL_COMMUNITY)
Admission: AD | Admit: 2021-05-12 | Payer: BC Managed Care – PPO | Source: Home / Self Care | Admitting: Obstetrics & Gynecology

## 2021-05-12 SURGERY — Surgical Case
Anesthesia: Epidural | Site: Abdomen | Wound class: Clean Contaminated

## 2021-05-12 MED ORDER — PROMETHAZINE HCL 25 MG/ML IJ SOLN: 6.2500 mg | INTRAMUSCULAR | Status: DC | PRN

## 2021-05-12 MED ORDER — KETOROLAC TROMETHAMINE 30 MG/ML IJ SOLN: 30.0000 mg | Freq: Four times a day (QID) | INTRAMUSCULAR | Status: AC | PRN

## 2021-05-12 MED ORDER — DEXAMETHASONE SODIUM PHOSPHATE 4 MG/ML IJ SOLN
INTRAMUSCULAR | Status: DC | PRN
  Administered 2021-05-12: 4 mg via INTRAVENOUS

## 2021-05-12 MED ORDER — STERILE WATER FOR IRRIGATION IR SOLN
Status: DC | PRN
  Administered 2021-05-12: 1000 mL

## 2021-05-12 MED ORDER — PHENYLEPHRINE 40 MCG/ML (10ML) SYRINGE FOR IV PUSH (FOR BLOOD PRESSURE SUPPORT)
PREFILLED_SYRINGE | INTRAVENOUS | Status: AC
  Filled 2021-05-12: qty 10

## 2021-05-12 MED ORDER — ACETAMINOPHEN 325 MG PO TABS
650.0000 mg | ORAL_TABLET | ORAL | Status: DC | PRN
  Administered 2021-05-12 – 2021-05-15 (×5): 650 mg via ORAL
  Filled 2021-05-12 (×6): qty 2

## 2021-05-12 MED ORDER — OXYTOCIN-SODIUM CHLORIDE 30-0.9 UT/500ML-% IV SOLN: 2.5000 [IU]/h | INTRAVENOUS | Status: AC

## 2021-05-12 MED ORDER — NALBUPHINE HCL 10 MG/ML IJ SOLN: 5.0000 mg | INTRAMUSCULAR | Status: DC | PRN

## 2021-05-12 MED ORDER — ACETAMINOPHEN 160 MG/5ML PO SOLN: 325.0000 mg | Freq: Once | ORAL | Status: DC | PRN

## 2021-05-12 MED ORDER — NALOXONE HCL 4 MG/10ML IJ SOLN
1.0000 ug/kg/h | INTRAVENOUS | Status: DC | PRN
  Filled 2021-05-12: qty 5

## 2021-05-12 MED ORDER — DIPHENHYDRAMINE HCL 25 MG PO CAPS: 25.0000 mg | ORAL_CAPSULE | ORAL | Status: DC | PRN

## 2021-05-12 MED ORDER — SODIUM BICARBONATE 8.4 % IV SOLN
INTRAVENOUS | Status: DC | PRN
  Administered 2021-05-12 (×2): 5 mL via EPIDURAL

## 2021-05-12 MED ORDER — ACETAMINOPHEN 10 MG/ML IV SOLN: 1000.0000 mg | Freq: Once | INTRAVENOUS | Status: DC | PRN

## 2021-05-12 MED ORDER — LIDOCAINE HCL (PF) 1 % IJ SOLN
INTRAMUSCULAR | Status: DC | PRN
  Administered 2021-05-12: 2 mL via EPIDURAL
  Administered 2021-05-12: 3 mL via EPIDURAL
  Administered 2021-05-12: 5 mL via EPIDURAL

## 2021-05-12 MED ORDER — NALBUPHINE HCL 10 MG/ML IJ SOLN: 5.0000 mg | Freq: Once | INTRAMUSCULAR | Status: DC | PRN

## 2021-05-12 MED ORDER — OXYTOCIN-SODIUM CHLORIDE 30-0.9 UT/500ML-% IV SOLN
INTRAVENOUS | Status: DC | PRN
  Administered 2021-05-12: 300 mL via INTRAVENOUS

## 2021-05-12 MED ORDER — CEFAZOLIN SODIUM-DEXTROSE 2-4 GM/100ML-% IV SOLN
INTRAVENOUS | Status: AC
  Filled 2021-05-12: qty 100

## 2021-05-12 MED ORDER — MEPERIDINE HCL 25 MG/ML IJ SOLN: 6.2500 mg | INTRAMUSCULAR | Status: DC | PRN

## 2021-05-12 MED ORDER — DIPHENHYDRAMINE HCL 50 MG/ML IJ SOLN: 12.5000 mg | INTRAMUSCULAR | Status: DC | PRN

## 2021-05-12 MED ORDER — ONDANSETRON HCL 4 MG/2ML IJ SOLN: 4.0000 mg | Freq: Three times a day (TID) | INTRAMUSCULAR | Status: DC | PRN

## 2021-05-12 MED ORDER — ACETAMINOPHEN 10 MG/ML IV SOLN
INTRAVENOUS | Status: AC
  Filled 2021-05-12: qty 100

## 2021-05-12 MED ORDER — NALOXONE HCL 0.4 MG/ML IJ SOLN: 0.4000 mg | INTRAMUSCULAR | Status: DC | PRN

## 2021-05-12 MED ORDER — LACTATED RINGERS IV SOLN: INTRAVENOUS | Status: DC | PRN

## 2021-05-12 MED ORDER — FENTANYL CITRATE (PF) 100 MCG/2ML IJ SOLN: 25.0000 ug | INTRAMUSCULAR | Status: DC | PRN

## 2021-05-12 MED ORDER — SIMETHICONE 80 MG PO CHEW
80.0000 mg | CHEWABLE_TABLET | Freq: Three times a day (TID) | ORAL | Status: DC
  Administered 2021-05-13 – 2021-05-15 (×7): 80 mg via ORAL
  Filled 2021-05-12 (×7): qty 1

## 2021-05-12 MED ORDER — FENTANYL CITRATE (PF) 100 MCG/2ML IJ SOLN
INTRAMUSCULAR | Status: AC
  Filled 2021-05-12: qty 2

## 2021-05-12 MED ORDER — SODIUM CHLORIDE 0.9 % IR SOLN
Status: DC | PRN
  Administered 2021-05-12: 1000 mL

## 2021-05-12 MED ORDER — WITCH HAZEL-GLYCERIN EX PADS
1.0000 | MEDICATED_PAD | CUTANEOUS | Status: DC | PRN
Start: 2021-05-12 — End: 2021-05-15

## 2021-05-12 MED ORDER — LACTATED RINGERS IV SOLN: 500.0000 mL | Freq: Once | INTRAVENOUS | Status: DC

## 2021-05-12 MED ORDER — SENNOSIDES-DOCUSATE SODIUM 8.6-50 MG PO TABS
2.0000 | ORAL_TABLET | Freq: Every day | ORAL | Status: DC
  Administered 2021-05-13 – 2021-05-15 (×3): 2 via ORAL
  Filled 2021-05-12 (×3): qty 2

## 2021-05-12 MED ORDER — SODIUM CHLORIDE 0.9% FLUSH: 3.0000 mL | INTRAVENOUS | Status: DC | PRN

## 2021-05-12 MED ORDER — MORPHINE SULFATE (PF) 0.5 MG/ML IJ SOLN
INTRAMUSCULAR | Status: AC
  Filled 2021-05-12: qty 10

## 2021-05-12 MED ORDER — PANTOPRAZOLE SODIUM 20 MG PO TBEC
20.0000 mg | DELAYED_RELEASE_TABLET | Freq: Every day | ORAL | Status: DC
  Administered 2021-05-13 – 2021-05-15 (×3): 20 mg via ORAL
  Filled 2021-05-12 (×3): qty 1

## 2021-05-12 MED ORDER — ONDANSETRON HCL 4 MG/2ML IJ SOLN
INTRAMUSCULAR | Status: AC
  Filled 2021-05-12: qty 2

## 2021-05-12 MED ORDER — DIBUCAINE (PERIANAL) 1 % EX OINT: 1.0000 "application " | TOPICAL_OINTMENT | CUTANEOUS | Status: DC | PRN

## 2021-05-12 MED ORDER — MORPHINE SULFATE (PF) 0.5 MG/ML IJ SOLN
INTRAMUSCULAR | Status: DC | PRN
  Administered 2021-05-12: 3 mg via EPIDURAL

## 2021-05-12 MED ORDER — TETANUS-DIPHTH-ACELL PERTUSSIS 5-2.5-18.5 LF-MCG/0.5 IM SUSY: 0.5000 mL | PREFILLED_SYRINGE | Freq: Once | INTRAMUSCULAR | Status: DC

## 2021-05-12 MED ORDER — ONDANSETRON HCL 4 MG/2ML IJ SOLN
INTRAMUSCULAR | Status: DC | PRN
  Administered 2021-05-12: 4 mg via INTRAVENOUS

## 2021-05-12 MED ORDER — SCOPOLAMINE 1 MG/3DAYS TD PT72: 1.0000 | MEDICATED_PATCH | Freq: Once | TRANSDERMAL | Status: DC

## 2021-05-12 MED ORDER — DEXAMETHASONE SODIUM PHOSPHATE 4 MG/ML IJ SOLN
INTRAMUSCULAR | Status: AC
  Filled 2021-05-12: qty 1

## 2021-05-12 MED ORDER — ACETAMINOPHEN 325 MG PO TABS: 325.0000 mg | ORAL_TABLET | Freq: Once | ORAL | Status: DC | PRN

## 2021-05-12 MED ORDER — SIMETHICONE 80 MG PO CHEW: 80.0000 mg | CHEWABLE_TABLET | ORAL | Status: DC | PRN

## 2021-05-12 MED ORDER — ACETAMINOPHEN 10 MG/ML IV SOLN
INTRAVENOUS | Status: DC | PRN
  Administered 2021-05-12: 1000 mg via INTRAVENOUS

## 2021-05-12 MED ORDER — LACTATED RINGERS IV SOLN: INTRAVENOUS | Status: DC

## 2021-05-12 MED ORDER — PRENATAL MULTIVITAMIN CH
1.0000 | ORAL_TABLET | Freq: Every day | ORAL | Status: DC
  Administered 2021-05-13 – 2021-05-14 (×2): 1 via ORAL
  Filled 2021-05-12 (×2): qty 1

## 2021-05-12 MED ORDER — IBUPROFEN 600 MG PO TABS
600.0000 mg | ORAL_TABLET | Freq: Four times a day (QID) | ORAL | Status: DC
  Administered 2021-05-13 – 2021-05-15 (×9): 600 mg via ORAL
  Filled 2021-05-12 (×9): qty 1

## 2021-05-12 MED ORDER — CEFAZOLIN SODIUM-DEXTROSE 2-3 GM-%(50ML) IV SOLR
INTRAVENOUS | Status: DC | PRN
  Administered 2021-05-12: 2 g via INTRAVENOUS

## 2021-05-12 MED ORDER — KETOROLAC TROMETHAMINE 30 MG/ML IJ SOLN
30.0000 mg | Freq: Four times a day (QID) | INTRAMUSCULAR | Status: AC | PRN
  Administered 2021-05-12 – 2021-05-13 (×3): 30 mg via INTRAVENOUS
  Filled 2021-05-12 (×2): qty 1

## 2021-05-12 MED ORDER — SODIUM BICARBONATE 8.4 % IV SOLN
INTRAVENOUS | Status: AC
  Filled 2021-05-12: qty 50

## 2021-05-12 MED ORDER — ZOLPIDEM TARTRATE 5 MG PO TABS: 5.0000 mg | ORAL_TABLET | Freq: Every evening | ORAL | Status: DC | PRN

## 2021-05-12 MED ORDER — METOCLOPRAMIDE HCL 5 MG/ML IJ SOLN
INTRAMUSCULAR | Status: AC
  Filled 2021-05-12: qty 2

## 2021-05-12 MED ORDER — OXYCODONE HCL 5 MG PO TABS
5.0000 mg | ORAL_TABLET | ORAL | Status: DC | PRN
  Administered 2021-05-14: 5 mg via ORAL
  Filled 2021-05-12: qty 1

## 2021-05-12 MED ORDER — KETOROLAC TROMETHAMINE 30 MG/ML IJ SOLN
INTRAMUSCULAR | Status: AC
  Filled 2021-05-12: qty 1

## 2021-05-12 MED ORDER — DIPHENHYDRAMINE HCL 25 MG PO CAPS: 25.0000 mg | ORAL_CAPSULE | Freq: Four times a day (QID) | ORAL | Status: DC | PRN

## 2021-05-12 MED ORDER — MENTHOL 3 MG MT LOZG: 1.0000 | LOZENGE | OROMUCOSAL | Status: DC | PRN

## 2021-05-12 MED ORDER — COCONUT OIL OIL
1.0000 "application " | TOPICAL_OIL | Status: DC | PRN
  Administered 2021-05-14: 1 via TOPICAL

## 2021-05-12 MED ORDER — OXYTOCIN-SODIUM CHLORIDE 30-0.9 UT/500ML-% IV SOLN
INTRAVENOUS | Status: AC
  Filled 2021-05-12: qty 500

## 2021-05-12 SURGICAL SUPPLY — 35 items
BENZOIN TINCTURE PRP APPL 2/3 (GAUZE/BANDAGES/DRESSINGS) ×2 IMPLANT
CHLORAPREP W/TINT 26ML (MISCELLANEOUS) ×3 IMPLANT
CLAMP CORD UMBIL (MISCELLANEOUS) IMPLANT
CLOSURE STERI STRIP 1/2 X4 (GAUZE/BANDAGES/DRESSINGS) ×2 IMPLANT
CLOSURE WOUND 1/2 X4 (GAUZE/BANDAGES/DRESSINGS)
CLOTH BEACON ORANGE TIMEOUT ST (SAFETY) ×3 IMPLANT
DRSG OPSITE POSTOP 4X10 (GAUZE/BANDAGES/DRESSINGS) ×3 IMPLANT
ELECT REM PT RETURN 9FT ADLT (ELECTROSURGICAL) ×3
ELECTRODE REM PT RTRN 9FT ADLT (ELECTROSURGICAL) ×1 IMPLANT
EXTRACTOR VACUUM KIWI (MISCELLANEOUS) ×2 IMPLANT
EXTRACTOR VACUUM M CUP 4 TUBE (SUCTIONS) IMPLANT
EXTRACTOR VACUUM M CUP 4' TUBE (SUCTIONS)
GLOVE BIOGEL PI IND STRL 7.0 (GLOVE) ×2 IMPLANT
GLOVE BIOGEL PI IND STRL 7.5 (GLOVE) ×1 IMPLANT
GLOVE BIOGEL PI INDICATOR 7.0 (GLOVE) ×4
GLOVE BIOGEL PI INDICATOR 7.5 (GLOVE) ×2
GLOVE ECLIPSE 7.0 STRL STRAW (GLOVE) ×3 IMPLANT
GOWN STRL REUS W/TWL LRG LVL3 (GOWN DISPOSABLE) ×6 IMPLANT
KIT ABG SYR 3ML LUER SLIP (SYRINGE) IMPLANT
NDL HYPO 25X5/8 SAFETYGLIDE (NEEDLE) IMPLANT
NEEDLE HYPO 25X5/8 SAFETYGLIDE (NEEDLE) IMPLANT
NS IRRIG 1000ML POUR BTL (IV SOLUTION) ×3 IMPLANT
PACK C SECTION WH (CUSTOM PROCEDURE TRAY) ×3 IMPLANT
PAD OB MATERNITY 4.3X12.25 (PERSONAL CARE ITEMS) ×3 IMPLANT
PENCIL SMOKE EVAC W/HOLSTER (ELECTROSURGICAL) ×3 IMPLANT
STRIP CLOSURE SKIN 1/2X4 (GAUZE/BANDAGES/DRESSINGS) IMPLANT
SUT PDS AB 0 CTX 60 (SUTURE) IMPLANT
SUT PLAIN 0 NONE (SUTURE) IMPLANT
SUT VIC AB 0 CT1 36 (SUTURE) ×3 IMPLANT
SUT VIC AB 0 CTX 36 (SUTURE) ×4
SUT VIC AB 0 CTX36XBRD ANBCTRL (SUTURE) ×2 IMPLANT
SUT VIC AB 4-0 KS 27 (SUTURE) ×3 IMPLANT
TOWEL OR 17X24 6PK STRL BLUE (TOWEL DISPOSABLE) ×3 IMPLANT
TRAY FOLEY W/BAG SLVR 14FR LF (SET/KITS/TRAYS/PACK) ×3 IMPLANT
WATER STERILE IRR 1000ML POUR (IV SOLUTION) ×3 IMPLANT

## 2021-05-12 NOTE — Progress Notes (Signed)
Cervix feels 3-4cm dilated around FB and likely read to deflate even though hasn't come out with pulling.  Patient is feeling uncomfortable and starting to feel back pain and contractions.  Pitocin at 2 while FB in and not titrating.  Patient requests CLE before FB removal.  Will alert anethesia and then re-assess.  Once FB out, will increase pitocin.    Rosie Fate MD

## 2021-05-12 NOTE — Anesthesia Postprocedure Evaluation (Signed)
Anesthesia Post Note  Patient: Judith Levine  Procedure(s) Performed: CESAREAN SECTION (Abdomen)     Patient location during evaluation: PACU Anesthesia Type: Epidural Level of consciousness: oriented and awake and alert Pain management: pain level controlled Vital Signs Assessment: post-procedure vital signs reviewed and stable Respiratory status: spontaneous breathing, respiratory function stable and nonlabored ventilation Cardiovascular status: blood pressure returned to baseline and stable Postop Assessment: no headache, no backache, no apparent nausea or vomiting, epidural receding and patient able to bend at knees Anesthetic complications: no   No notable events documented.  Last Vitals:  Vitals:   05/12/21 1945 05/12/21 1948  BP: 118/62   Pulse: 77 67  Resp: (!) 21 17  Temp: 36.4 C   SpO2: 99%     Last Pain:  Vitals:   05/12/21 1945  TempSrc: Oral  PainSc: 0-No pain   Pain Goal:                Epidural/Spinal Function Cutaneous sensation: Tingles (05/12/21 1945), Patient able to flex knees: No (05/12/21 1945), Patient able to lift hips off bed: No (05/12/21 1945), Back pain beyond tenderness at insertion site: No (05/12/21 1945), Progressively worsening motor and/or sensory loss: No (05/12/21 1945), Bowel and/or bladder incontinence post epidural: No (05/12/21 1945)  Nasean Zapf A.

## 2021-05-12 NOTE — Transfer of Care (Signed)
Immediate Anesthesia Transfer of Care Note  Patient: Judith Levine  Procedure(s) Performed: CESAREAN SECTION (Abdomen)  Patient Location: PACU  Anesthesia Type:Epidural  Level of Consciousness: awake, alert  and oriented  Airway & Oxygen Therapy: Patient Spontanous Breathing  Post-op Assessment: Report given to RN and Post -op Vital signs reviewed and stable  Post vital signs: Reviewed and stable  Last Vitals:  Vitals Value Taken Time  BP 104/71 05/12/21 1910  Temp    Pulse 69 05/12/21 1913  Resp 16 05/12/21 1913  SpO2 100 % 05/12/21 1913  Vitals shown include unvalidated device data.  Last Pain:  Vitals:   05/12/21 1739  TempSrc:   PainSc: 0-No pain         Complications: No notable events documented.

## 2021-05-12 NOTE — Lactation Note (Addendum)
This note was copied from a baby's chart. Lactation Consultation Note  Patient Name: Judith Levine [id number]'[id number] Date: 05/12/2021 Reason for consult: Initial assessment;Term;1st time breastfeeding Age:38 hours, term female infant, one stool. Spanish Interpeter This is mom's third time latching infant at the breast, infant breastfeed for 30 minutes in recovery and 20 minutes in [place] at 2010. LC ask mom to pre-pump breast with hand pump prior to latching infant on her left breast using the football hold position. Infant sustained latch and was still breastfeeding after 10 minutes when LC left the room.  Mom has inverted nipples that responses well to stimulation, they are compressible and when using the hand pump they evert completely outward. LC discussed infant's input and output with mom. Mom made aware of O/P services, breastfeeding support groups, community resources, and our phone # for post-discharge questions.   Mom's plan: 1- use hand pump prior to latching infant at the breast, BF infant according to hunger cues, 8 to 12+ times within 24 hours, STS. 2- Mom can use DEBP her discretion. 3-Mom knows to call RN or LC if she has any questions, concerns or need assistance with latching infant at the breast.   Maternal Data Has patient been taught Hand Expression?: Yes Does the patient have breastfeeding experience prior to this delivery?: No  Feeding Mother's Current Feeding Choice: Breast Milk  LATCH Score Latch: Grasps breast easily, tongue down, lips flanged, rhythmical sucking.  Audible Swallowing: Spontaneous and intermittent  Type of Nipple: Inverted  Comfort (Breast/Nipple): Soft / non-tender  Hold (Positioning): Assistance needed to correctly position infant at breast and maintain latch.  LATCH Score: 7   Lactation Tools Discussed/Used Tools: Pump Breast pump type: Double-Electric Breast Pump;Manual Pump Education: Setup, frequency, and cleaning;Milk  Storage Reason for Pumping: Help evert nipple shaft out prior to latching infant at the breast. Pumping frequency: pump every 3 hours for 15 minutes on inital setting.  Interventions Interventions: Breast feeding basics reviewed;Assisted with latch;Skin to skin;Hand express;Pre-pump if needed;Breast compression;Adjust position;Support pillows;Position options;Expressed milk;Hand pump;DEBP  Discharge Pump: Personal;Manual;DEBP WIC Program: Yes  Consult Status Consult Status: Follow-up Date: 05/13/21 Follow-up type: In-patient    Danelle Earthly 05/12/2021, 10:55 PM

## 2021-05-12 NOTE — Anesthesia Preprocedure Evaluation (Signed)
Anesthesia Evaluation  Patient identified by MRN, date of birth, ID band Patient awake    Reviewed: Allergy & Precautions, Patient's Chart, lab work & pertinent test results  Airway Mallampati: II  TM Distance: >3 FB     Dental   Pulmonary Current Smoker,    breath sounds clear to auscultation       Cardiovascular negative cardio ROS   Rhythm:Regular Rate:Normal     Neuro/Psych negative neurological ROS     GI/Hepatic negative GI ROS, Neg liver ROS,   Endo/Other  negative endocrine ROS  Renal/GU negative Renal ROS     Musculoskeletal   Abdominal   Peds  Hematology negative hematology ROS (+)   Anesthesia Other Findings   Reproductive/Obstetrics (+) Pregnancy                             Lab Results  Component Value Date   WBC 14.4 (H) 05/11/2021   HGB 12.3 05/11/2021   HCT 37.2 05/11/2021   MCV 82.9 05/11/2021   PLT 316 05/11/2021    Anesthesia Physical Anesthesia Plan  ASA: 2  Anesthesia Plan: Epidural   Post-op Pain Management:    Induction:   PONV Risk Score and Plan: 1 and Treatment may vary due to age or medical condition  Airway Management Planned: Natural Airway  Additional Equipment:   Intra-op Plan:   Post-operative Plan:   Informed Consent: I have reviewed the patients History and Physical, chart, labs and discussed the procedure including the risks, benefits and alternatives for the proposed anesthesia with the patient or authorized representative who has indicated his/her understanding and acceptance.       Plan Discussed with:   Anesthesia Plan Comments:         Anesthesia Quick Evaluation

## 2021-05-12 NOTE — Progress Notes (Signed)
Labor Progress Note  Patient resting comfortably, no pain with epidural.  Foley balloon released.  With ballon out, exam reveals persistently thick cervix. Externally 3-4 cm.  Internal os found and 1.5 cm.  AROM performed, head was well applied.  Clear fluid noted.  Pitocin currently 10 mU/min.  FHT cat 1.  Patient desires continued induction. Titrate pit as able.  Nilda Simmer MD

## 2021-05-12 NOTE — Progress Notes (Signed)
Labor Progress Note  Patient is admitted for IOL at 37w for suspected IUGR.  Labor course notable for prolonged cervical ripening.  Recently she has made cervical change with foley balloon in place. She is now comfortable with epidural. Cervix 3-4 cm, stretchy, but balloon does not come out with gentile traction.  Patient has responded well to pitocin and FHT cat 1 with accelerations.  Will allow a bit more time with foley in place, await additional dilation, then plan for AROM and/or pitocin titration if able.  Discussed with patient.  Despite long induction, she is hopeful for vaginal delivery.  Nilda Simmer MD

## 2021-05-12 NOTE — Op Note (Signed)
Operative Note  PROCEDURE DATE: 05/12/2021   PREOPERATIVE DIAGNOSIS: AMA, suspected IUGR, IVF pregnancy, failure to progress, fetal nonreassuring status   POSTOPERATIVE DIAGNOSIS: The same   PROCEDURE:    Primar Low Transverse Cesarean Section   SURGEON:  Dr. Nilda Simmer MD   INDICATIONS: This is a 39yo G1P0 at 67 wga requiring cesarean section secondary to failure to progress, nonreassuring fetal status.   Patient underwent prolonged induction, required 5 doses of cytotec, foley balloon, AROM, titrated pitocin.   Repetitive late decels were noted.  After 40+ hours of interventions she did not preogress beyond 4 cm dilation and with persistent thick cervix. Decision made to proceed with LTCS. The risks of cesarean section discussed with the patient included but were not limited to: bleeding which may require transfusion or reoperation; infection which may require antibiotics; injury to bowel, bladder, ureters or other surrounding organs; injury to the fetus; need for additional procedures including hysterectomy in the event of a life-threatening hemorrhage; placental abnormalities wth subsequent pregnancies, incisional problems, thromboembolic phenomenon and other postoperative/anesthesia complications. The patient agreed with the proposed plan, giving informed consent for the procedure.     FINDINGS:  Viable female infant in vertex presentation, APGARs5, 9,  Weight pending, Amniotic fluid clear,  Intact placenta, three vessel cord.  Grossly normal uterus .   ANESTHESIA:    Epidural ESTIMATED BLOOD LOSS: 650 mL  SPECIMENS: Placenta for pathology (IUGR) COMPLICATIONS: None immediate   PROCEDURE IN DETAIL:  The patient received intravenous antibiotics (2g Ancef) and had sequential compression devices applied to her lower extremities while in the preoperative area.  She was then taken to the operating room where epidural anesthesia was dosed up to surgical level and was found to be adequate.  She was then placed in a dorsal supine position with a leftward tilt, and prepped and draped in a sterile manner.  A foley catheter was placed into her bladder and attached to constant gravity.  After an adequate timeout was performed, a Pfannenstiel skin incision was made with scalpel and carried through to the underlying layer of fascia. The fascia was incised in the midline and this incision was extended bilaterally using the Mayo scissors. Kocher clamps were applied to the superior aspect of the fascial incision and the underlying rectus muscles were dissected off bluntly. A similar process was carried out on the inferior aspect of the facial incision. The rectus muscles were separated in the midline bluntly and the peritoneum was entered bluntly.  A bladder flap was created sharply and developed bluntly. A transverse hysterotomy was made with a scalpel and extended bilaterally bluntly. The bladder blade was then removed. Some difficult was noted with delivery, vacuum assistance was done x2 with progress, followed byextension of hysterotomy with bandage scissors and adjustment of fundal pressure. The infant was successfully delivered, and cord was clamped and cut and infant was handed over to awaiting neonatology team. Uterine massage was then administered and the placenta delivered intact with three-vessel cord. Cord gases were taken. The uterus was cleared of clot and debris.  The hysterotomy was closed with 0 vicryl.  A second imbricating suture of 0-vicryl was used to reinforce the incision and aid in hemostasis.The fascia was closed with 0-Vicryl in a running fashion with good restoration of anatomy.  The subcutaneus tissue was irrigated and was reapproximated using three interrupted plain gut stitches.  The skin was closed with 4-0 Vicryl in a subcuticular fashion.  All surgical site and was hemostatic at end of  procedure) without any further bleeding on exam.    Pt tolerated the procedure well. All  sponge/lap/needle counts were correct  X 2. Pt taken to recovery room in stable condition.   Nilda Simmer MD

## 2021-05-12 NOTE — Progress Notes (Signed)
Called to bedside for series of prolonged decels.   Patient has been on 18 mU/min pitocin, well tolerated until variability decreased briefly and at 16:33 had 7x late decels with lowest nadir to 60s. I was contacted by phone, pitocin was shut off, terbutaline was given, and position changed resolved ctx and decels.  FHT improved to 140/moderate/no accels/no decels.  I discussed labor course with patient with aid of translation from support person.  Patient reports she understands fully the conversation we had based on my english and supplemented translated Spanish.  I discussed prolonged induction, which has included 5 doses of cytotec, foley bulb, pitocin titration to 18 mU/min, AROM.  Her cervix has made slow progress to about 4 cm, however remains impressively thick (estimated 3-4 cm in thickness).  I discussed we have passed the 40 hr mark and given recent nonreassuring FHT in a suspected growth-restricted baby, I offered and recommended Cesarean section.  We discussed risks and benefits, which include but are not limited to bleeding, infection, damage to nearby organs, need for blood transfusion or additional procedures. All questions were answered and patient accepts. Again patient reports complete understanding of our discussion.   OR alerted. Pitocin is off.  Nilda Simmer MD

## 2021-05-12 NOTE — Anesthesia Procedure Notes (Signed)
Epidural Patient location during procedure: OB Start time: 05/12/2021 6:52 AM End time: 05/12/2021 6:58 AM  Staffing Anesthesiologist: Marcene Duos, MD Performed: anesthesiologist   Preanesthetic Checklist Completed: patient identified, IV checked, site marked, risks and benefits discussed, surgical consent, monitors and equipment checked, pre-op evaluation and timeout performed  Epidural Patient position: sitting Prep: DuraPrep and site prepped and draped Patient monitoring: continuous pulse ox and blood pressure Approach: midline Location: L4-L5 Injection technique: LOR air  Needle:  Needle type: Tuohy  Needle gauge: 17 G Needle length: 9 cm and 9 Needle insertion depth: 7 cm Catheter type: closed end flexible Catheter size: 19 Gauge Catheter at skin depth: 12 cm Test dose: negative  Assessment Events: blood not aspirated, injection not painful, no injection resistance, no paresthesia and negative IV test

## 2021-05-13 ENCOUNTER — Encounter (HOSPITAL_COMMUNITY): Payer: Self-pay | Admitting: Obstetrics and Gynecology

## 2021-05-13 LAB — CBC
HCT: 27.9 % — ABNORMAL LOW (ref 36.0–46.0)
Hemoglobin: 9.2 g/dL — ABNORMAL LOW (ref 12.0–15.0)
MCH: 27.9 pg (ref 26.0–34.0)
MCHC: 33 g/dL (ref 30.0–36.0)
MCV: 84.5 fL (ref 80.0–100.0)
Platelets: 235 10*3/uL (ref 150–400)
RBC: 3.3 MIL/uL — ABNORMAL LOW (ref 3.87–5.11)
RDW: 12.2 % (ref 11.5–15.5)
WBC: 17.6 10*3/uL — ABNORMAL HIGH (ref 4.0–10.5)
nRBC: 0 % (ref 0.0–0.2)

## 2021-05-13 NOTE — Lactation Note (Signed)
This note was copied from a baby's chart. Lactation Consultation Note  Patient Name: Judith Levine Date: 05/13/2021 Reason for consult: Follow-up assessment Age:38 hours  LC in to room for follow up. Mother states breastfeeding is going well. Discussed normal newborn behavior and patterns in addition to clusterfeeding. Mother hand expresses and uses hand pump as needed.  Plan: 1-Skin to skin 2-Aim for a deep, comfortable latch 3-Breastfeeding on demand or 8-12 times in 24h period. 4-Keep infant awake during breastfeeding session: massaging breast, infant's hand/shoulder/feet 5-Monitor voids and stools as signs good intake.  6-Encouraged maternal rest, hydration and food intake.  7-Contact LC as needed for feeds/support/concerns/questions   All questions answered at this time.    Feeding Mother's Current Feeding Choice: Breast Milk  LATCH Score Latch: Grasps breast easily, tongue down, lips flanged, rhythmical sucking.  Audible Swallowing: A few with stimulation  Type of Nipple: Everted at rest and after stimulation  Comfort (Breast/Nipple): Soft / non-tender  Hold (Positioning): Assistance needed to correctly position infant at breast and maintain latch.  LATCH Score: 8  Interventions Interventions: Breast feeding basics reviewed;Skin to skin;Education;Expressed milk;Hand express;Hand pump;DEBP  Consult Status Consult Status: Follow-up Date: 05/14/21 Follow-up type: In-patient    Judith Levine 05/13/2021, 7:28 PM

## 2021-05-13 NOTE — Progress Notes (Signed)
Subjective: Postpartum Day 1: Cesarean Delivery Patient reports tolerating PO and no problems voiding.    Objective: Vital signs in last 24 hours: Temp:  [97.5 F (36.4 C)-98.2 F (36.8 C)] 98.2 F (36.8 C) (06/10 0821) Pulse Rate:  [59-93] 72 (06/10 0821) Resp:  [13-21] 17 (06/10 0821) BP: (97-126)/(62-98) 112/74 (06/10 0821) SpO2:  [97 %-100 %] 99 % (06/10 7253)  Physical Exam:  General: alert, cooperative, appears stated age, and no distress Lochia: appropriate Uterine Fundus: firm Incision: healing well, no significant drainage DVT Evaluation: No evidence of DVT seen on physical exam.  Recent Labs    05/11/21 0048 05/13/21 0514  HGB 12.3 9.2*  HCT 37.2 27.9*    Assessment/Plan: Status post Cesarean section. Doing well postoperatively.  Continue current care.  Turner Daniels 05/13/2021, 9:10 AM

## 2021-05-13 NOTE — Lactation Note (Addendum)
This note was copied from a baby's chart. Lactation Consultation Note  Patient Name: Judith Levine [id number] Date: 05/13/2021 Reason for consult: Mother's request;Term;Hyperbilirubinemia;1st time breastfeeding;Primapara Age:38 hours Consult was done in Spanish:  LC back in to room per mother's request to check latch. LC discussed proper alignment and support. Infant is latched to right breast with good suckling and swallowing pattern. Mother is proficient with hand expression.   Plan: 1-Skin to skin 2-Aim for a deep, comfortable latch 3-Breastfeeding on demand or 8-12 times in 24h period. 4-Keep infant awake during breastfeeding session: massaging breast, infant's hand/shoulder/feet 5-Monitor voids and stools as signs good intake.  6-Encouraged maternal rest, hydration and food intake.  7-Contact LC as needed for feeds/support/concerns/questions  Feeding Mother's Current Feeding Choice: Breast Milk  LATCH Score Latch: Grasps breast easily, tongue down, lips flanged, rhythmical sucking.  Audible Swallowing: Spontaneous and intermittent  Type of Nipple: Everted at rest and after stimulation  Comfort (Breast/Nipple): Soft / non-tender  Hold (Positioning): Assistance needed to correctly position infant at breast and maintain latch.  LATCH Score: 9   Lactation Tools Discussed/Used Tools: Pump Breast pump type: Double-Electric Breast Pump;Manual  Interventions Interventions: Breast feeding basics reviewed;Assisted with latch;Skin to skin;Hand express;Expressed milk;Support pillows;Adjust position;Education;Breast compression  Consult Status Consult Status: Follow-up Date: 05/14/21 Follow-up type: In-patient    Judith Levine 05/13/2021, 22:30 PM

## 2021-05-13 NOTE — Progress Notes (Addendum)
Honeycomb dressing changed per provider order, incision edges approximated with no signs or symptoms of infection.  Dressing changed without difficulty, reviewed care with patient.

## 2021-05-14 NOTE — Lactation Note (Signed)
This note was copied from a baby's chart. Lactation Consultation Note  Patient Name: Judith Levine [id number] Date: 05/14/2021 Reason for consult: Follow-up assessment;Term;1st time breastfeeding;Primapara Age:38 hours  LC in to room for follow up. Mother states breastfeeding is going well and infant is clusterfeeding. Discussed normal newborn behavior and patterns in addition to clusterfeeding. Mother will call for latch check with next feeding. Provided comfort gels for nipple soreness. Plan: 1-Skin to skin 2-Aim for a deep, comfortable latch 3-Breastfeeding on demand or 8-12 times in 24h period. 4-Keep infant awake during breastfeeding session: massaging breast, infant's hand/shoulder/feet 5-Monitor voids and stools as signs good intake.  6-Encouraged maternal rest, hydration and food intake.  7-Contact LC as needed for feeds/support/concerns/questions   All questions answered at this time.    Feeding Mother's Current Feeding Choice: Breast Milk  Interventions Interventions: Breast feeding basics reviewed;Education;Expressed milk;Skin to skin;Hand express  Discharge Pump: DEBP;Manual;Personal  Consult Status Consult Status: Follow-up Date: 05/15/21 Follow-up type: In-patient    Dametri Ozburn A Higuera Ancidey 05/14/2021, 1:33 PM

## 2021-05-14 NOTE — Plan of Care (Signed)
Problem: Education: Goal: Knowledge of Childbirth will improve Outcome: Adequate for Discharge Goal: Ability to make informed decisions regarding treatment and plan of care will improve Outcome: Adequate for Discharge Goal: Ability to state and carry out methods to decrease the pain will improve Outcome: Adequate for Discharge Goal: Individualized Educational Video(s) Outcome: Adequate for Discharge   Problem: Coping: Goal: Ability to verbalize concerns and feelings about labor and delivery will improve Outcome: Adequate for Discharge   Problem: Life Cycle: Goal: Ability to make normal progression through stages of labor will improve Outcome: Adequate for Discharge Goal: Ability to effectively push during vaginal delivery will improve Outcome: Adequate for Discharge   Problem: Role Relationship: Goal: Will demonstrate positive interactions with the child 05/14/2021 0020 by Jenny Reichmann, RN Outcome: Adequate for Discharge 05/14/2021 0019 by Jenny Reichmann, RN Outcome: Adequate for Discharge 05/14/2021 0018 by Jenny Reichmann, RN Outcome: Adequate for Discharge   Problem: Safety: Goal: Risk of complications during labor and delivery will decrease Outcome: Adequate for Discharge   Problem: Pain Management: Goal: Relief or control of pain from uterine contractions will improve 05/14/2021 0020 by Jenny Reichmann, RN Outcome: Adequate for Discharge 05/14/2021 0019 by Jenny Reichmann, RN Outcome: Adequate for Discharge   Problem: Education: Goal: Knowledge of General Education information will improve Description: Including pain rating scale, medication(s)/side effects and non-pharmacologic comfort measures Outcome: Adequate for Discharge   Problem: Health Behavior/Discharge Planning: Goal: Ability to manage health-related needs will improve Outcome: Adequate for Discharge   Problem: Clinical Measurements: Goal: Ability to maintain clinical measurements within  normal limits will improve Outcome: Adequate for Discharge Goal: Will remain free from infection Outcome: Adequate for Discharge Goal: Diagnostic test results will improve 05/14/2021 0020 by Jenny Reichmann, RN Outcome: Adequate for Discharge 05/14/2021 0019 by Jenny Reichmann, RN Outcome: Adequate for Discharge Goal: Respiratory complications will improve 05/14/2021 0020 by Jenny Reichmann, RN Outcome: Adequate for Discharge 05/14/2021 0019 by Jenny Reichmann, RN Outcome: Adequate for Discharge Goal: Cardiovascular complication will be avoided 05/14/2021 0020 by Jenny Reichmann, RN Outcome: Adequate for Discharge 05/14/2021 0019 by Jenny Reichmann, RN Outcome: Adequate for Discharge   Problem: Coping: Goal: Level of anxiety will decrease Outcome: Adequate for Discharge   Problem: Elimination: Goal: Will not experience complications related to bowel motility Outcome: Adequate for Discharge   Problem: Education: Goal: Knowledge of condition will improve 05/14/2021 0021 by Jenny Reichmann, RN Outcome: Adequate for Discharge 05/14/2021 0020 by Jenny Reichmann, RN Outcome: Adequate for Discharge 05/14/2021 0019 by Jenny Reichmann, RN Outcome: Adequate for Discharge 05/14/2021 0019 by Jenny Reichmann, RN Outcome: Adequate for Discharge Goal: Individualized Educational Video(s) Outcome: Adequate for Discharge Goal: Individualized Newborn Educational Video(s) Outcome: Adequate for Discharge   Problem: Coping: Goal: Ability to identify and utilize available resources and services will improve 05/14/2021 0020 by Jenny Reichmann, RN Outcome: Adequate for Discharge 05/14/2021 0019 by Jenny Reichmann, RN Outcome: Adequate for Discharge   Problem: Skin Integrity: Goal: Demonstration of wound healing without infection will improve Outcome: Adequate for Discharge   Problem: Education: Goal: Knowledge of condition will improve Outcome: Adequate for Discharge Goal:  Individualized Educational Video(s) Outcome: Adequate for Discharge Goal: Individualized Newborn Educational Video(s) Outcome: Adequate for Discharge   Problem: Activity: Goal: Will verbalize the importance of balancing activity with adequate rest periods Outcome: Adequate for Discharge Goal: Ability to tolerate increased activity will improve Outcome: Adequate for Discharge   Problem: Coping: Goal:  Ability to identify and utilize available resources and services will improve Outcome: Adequate for Discharge   Problem: Life Cycle: Goal: Chance of risk for complications during the postpartum period will decrease Outcome: Adequate for Discharge   Problem: Role Relationship: Goal: Ability to demonstrate positive interaction with newborn will improve Outcome: Adequate for Discharge   Problem: Skin Integrity: Goal: Demonstration of wound healing without infection will improve Outcome: Adequate for Discharge   Problem: Activity: Goal: Will verbalize the importance of balancing activity with adequate rest periods Outcome: Adequate for Discharge

## 2021-05-14 NOTE — Progress Notes (Signed)
Subjective: Postpartum Day 2: Cesarean Delivery Patient reports incisional pain, tolerating PO, + flatus, and no problems voiding.    Objective: Vital signs in last 24 hours: Temp:  [97.6 F (36.4 C)-98.3 F (36.8 C)] 98.3 F (36.8 C) (06/11 0454) Pulse Rate:  [70-80] 75 (06/11 0454) Resp:  [17-18] 18 (06/11 0454) BP: (94-153)/(64-101) 94/64 (06/11 0454) SpO2:  [99 %-100 %] 100 % (06/10 2032)  Physical Exam:  General: alert, cooperative, appears stated age, and no distress Lochia: appropriate Uterine Fundus: firm Incision: healing well DVT Evaluation: No evidence of DVT seen on physical exam.  Recent Labs    05/13/21 0514  HGB 9.2*  HCT 27.9*    Assessment/Plan: Status post Cesarean section. Doing well postoperatively.  Continue current care.  Judith Levine 05/14/2021, 9:18 AM

## 2021-05-14 NOTE — Plan of Care (Signed)
Problem: Education: Goal: Knowledge of Childbirth will improve Outcome: Adequate for Discharge Goal: Ability to make informed decisions regarding treatment and plan of care will improve Outcome: Adequate for Discharge Goal: Ability to state and carry out methods to decrease the pain will improve Outcome: Adequate for Discharge Goal: Individualized Educational Video(s) Outcome: Adequate for Discharge   Problem: Coping: Goal: Ability to verbalize concerns and feelings about labor and delivery will improve Outcome: Adequate for Discharge   Problem: Life Cycle: Goal: Ability to make normal progression through stages of labor will improve Outcome: Adequate for Discharge Goal: Ability to effectively push during vaginal delivery will improve Outcome: Adequate for Discharge   Problem: Role Relationship: Goal: Will demonstrate positive interactions with the child 05/14/2021 0020 by Jenny Reichmann, RN Outcome: Adequate for Discharge 05/14/2021 0019 by Jenny Reichmann, RN Outcome: Adequate for Discharge 05/14/2021 0018 by Jenny Reichmann, RN Outcome: Adequate for Discharge   Problem: Safety: Goal: Risk of complications during labor and delivery will decrease Outcome: Adequate for Discharge   Problem: Pain Management: Goal: Relief or control of pain from uterine contractions will improve 05/14/2021 0020 by Jenny Reichmann, RN Outcome: Adequate for Discharge 05/14/2021 0019 by Jenny Reichmann, RN Outcome: Adequate for Discharge   Problem: Education: Goal: Knowledge of General Education information will improve Description: Including pain rating scale, medication(s)/side effects and non-pharmacologic comfort measures Outcome: Adequate for Discharge   Problem: Health Behavior/Discharge Planning: Goal: Ability to manage health-related needs will improve Outcome: Adequate for Discharge   Problem: Clinical Measurements: Goal: Ability to maintain clinical measurements within  normal limits will improve Outcome: Adequate for Discharge Goal: Will remain free from infection Outcome: Adequate for Discharge Goal: Diagnostic test results will improve 05/14/2021 0020 by Jenny Reichmann, RN Outcome: Adequate for Discharge 05/14/2021 0019 by Jenny Reichmann, RN Outcome: Adequate for Discharge Goal: Respiratory complications will improve 05/14/2021 0020 by Jenny Reichmann, RN Outcome: Adequate for Discharge 05/14/2021 0019 by Jenny Reichmann, RN Outcome: Adequate for Discharge Goal: Cardiovascular complication will be avoided 05/14/2021 0020 by Jenny Reichmann, RN Outcome: Adequate for Discharge 05/14/2021 0019 by Jenny Reichmann, RN Outcome: Adequate for Discharge   Problem: Activity: Goal: Risk for activity intolerance will decrease 05/14/2021 0020 by Jenny Reichmann, RN Outcome: Adequate for Discharge 05/14/2021 0019 by Jenny Reichmann, RN Outcome: Adequate for Discharge   Problem: Nutrition: Goal: Adequate nutrition will be maintained 05/14/2021 0020 by Jenny Reichmann, RN Outcome: Adequate for Discharge 05/14/2021 0019 by Jenny Reichmann, RN Outcome: Adequate for Discharge   Problem: Coping: Goal: Level of anxiety will decrease Outcome: Adequate for Discharge   Problem: Elimination: Goal: Will not experience complications related to bowel motility Outcome: Adequate for Discharge Goal: Will not experience complications related to urinary retention 05/14/2021 0020 by Jenny Reichmann, RN Outcome: Adequate for Discharge 05/14/2021 0019 by Jenny Reichmann, RN Outcome: Adequate for Discharge   Problem: Pain Managment: Goal: General experience of comfort will improve 05/14/2021 0020 by Jenny Reichmann, RN Outcome: Adequate for Discharge 05/14/2021 0019 by Jenny Reichmann, RN Outcome: Adequate for Discharge   Problem: Safety: Goal: Ability to remain free from injury will improve 05/14/2021 0020 by Jenny Reichmann, RN Outcome:  Adequate for Discharge 05/14/2021 0019 by Jenny Reichmann, RN Outcome: Adequate for Discharge   Problem: Skin Integrity: Goal: Risk for impaired skin integrity will decrease Outcome: Adequate for Discharge   Problem: Education: Goal: Knowledge of condition will improve 05/14/2021 0021 by  Jenny Reichmann, RN Outcome: Adequate for Discharge 05/14/2021 0020 by Jenny Reichmann, RN Outcome: Adequate for Discharge 05/14/2021 0019 by Jenny Reichmann, RN Outcome: Adequate for Discharge 05/14/2021 0019 by Jenny Reichmann, RN Outcome: Adequate for Discharge Goal: Individualized Educational Video(s) Outcome: Adequate for Discharge Goal: Individualized Newborn Educational Video(s) Outcome: Adequate for Discharge   Problem: Activity: Goal: Will verbalize the importance of balancing activity with adequate rest periods 05/14/2021 0020 by Jenny Reichmann, RN Outcome: Adequate for Discharge 05/14/2021 0019 by Jenny Reichmann, RN Outcome: Adequate for Discharge Goal: Ability to tolerate increased activity will improve Outcome: Adequate for Discharge   Problem: Coping: Goal: Ability to identify and utilize available resources and services will improve 05/14/2021 0020 by Jenny Reichmann, RN Outcome: Adequate for Discharge 05/14/2021 0019 by Jenny Reichmann, RN Outcome: Adequate for Discharge   Problem: Life Cycle: Goal: Chance of risk for complications during the postpartum period will decrease Outcome: Adequate for Discharge   Problem: Role Relationship: Goal: Ability to demonstrate positive interaction with newborn will improve 05/14/2021 0020 by Jenny Reichmann, RN Outcome: Adequate for Discharge 05/14/2021 0019 by Jenny Reichmann, RN Outcome: Adequate for Discharge   Problem: Skin Integrity: Goal: Demonstration of wound healing without infection will improve Outcome: Adequate for Discharge   Problem: Education: Goal: Knowledge of condition will improve Outcome:  Adequate for Discharge Goal: Individualized Educational Video(s) Outcome: Adequate for Discharge Goal: Individualized Newborn Educational Video(s) Outcome: Adequate for Discharge   Problem: Activity: Goal: Will verbalize the importance of balancing activity with adequate rest periods Outcome: Adequate for Discharge Goal: Ability to tolerate increased activity will improve Outcome: Adequate for Discharge   Problem: Coping: Goal: Ability to identify and utilize available resources and services will improve Outcome: Adequate for Discharge   Problem: Life Cycle: Goal: Chance of risk for complications during the postpartum period will decrease Outcome: Adequate for Discharge   Problem: Role Relationship: Goal: Ability to demonstrate positive interaction with newborn will improve Outcome: Adequate for Discharge   Problem: Skin Integrity: Goal: Demonstration of wound healing without infection will improve Outcome: Adequate for Discharge   Problem: Activity: Goal: Will verbalize the importance of balancing activity with adequate rest periods Outcome: Adequate for Discharge   Problem: Education: Goal: Knowledge of Childbirth will improve Outcome: Adequate for Discharge Goal: Ability to make informed decisions regarding treatment and plan of care will improve Outcome: Adequate for Discharge Goal: Ability to state and carry out methods to decrease the pain will improve Outcome: Adequate for Discharge Goal: Individualized Educational Video(s) Outcome: Adequate for Discharge   Problem: Coping: Goal: Ability to verbalize concerns and feelings about labor and delivery will improve Outcome: Adequate for Discharge   Problem: Life Cycle: Goal: Ability to make normal progression through stages of labor will improve Outcome: Adequate for Discharge Goal: Ability to effectively push during vaginal delivery will improve Outcome: Adequate for Discharge   Problem: Role Relationship: Goal:  Will demonstrate positive interactions with the child 05/14/2021 0020 by Jenny Reichmann, RN Outcome: Adequate for Discharge 05/14/2021 0019 by Jenny Reichmann, RN Outcome: Adequate for Discharge 05/14/2021 0018 by Jenny Reichmann, RN Outcome: Adequate for Discharge   Problem: Safety: Goal: Risk of complications during labor and delivery will decrease Outcome: Adequate for Discharge   Problem: Pain Management: Goal: Relief or control of pain from uterine contractions will improve 05/14/2021 0020 by Jenny Reichmann, RN Outcome: Adequate for Discharge 05/14/2021 0019 by Jenny Reichmann, RN Outcome: Adequate for  Discharge   Problem: Education: Goal: Knowledge of General Education information will improve Description: Including pain rating scale, medication(s)/side effects and non-pharmacologic comfort measures Outcome: Adequate for Discharge   Problem: Health Behavior/Discharge Planning: Goal: Ability to manage health-related needs will improve Outcome: Adequate for Discharge   Problem: Clinical Measurements: Goal: Ability to maintain clinical measurements within normal limits will improve Outcome: Adequate for Discharge Goal: Will remain free from infection Outcome: Adequate for Discharge Goal: Diagnostic test results will improve 05/14/2021 0020 by Jenny Reichmann, RN Outcome: Adequate for Discharge 05/14/2021 0019 by Jenny Reichmann, RN Outcome: Adequate for Discharge Goal: Respiratory complications will improve 05/14/2021 0020 by Jenny Reichmann, RN Outcome: Adequate for Discharge 05/14/2021 0019 by Jenny Reichmann, RN Outcome: Adequate for Discharge Goal: Cardiovascular complication will be avoided 05/14/2021 0020 by Jenny Reichmann, RN Outcome: Adequate for Discharge 05/14/2021 0019 by Jenny Reichmann, RN Outcome: Adequate for Discharge   Problem: Activity: Goal: Risk for activity intolerance will decrease 05/14/2021 0020 by Jenny Reichmann,  RN Outcome: Adequate for Discharge 05/14/2021 0019 by Jenny Reichmann, RN Outcome: Adequate for Discharge   Problem: Nutrition: Goal: Adequate nutrition will be maintained 05/14/2021 0020 by Jenny Reichmann, RN Outcome: Adequate for Discharge 05/14/2021 0019 by Jenny Reichmann, RN Outcome: Adequate for Discharge   Problem: Coping: Goal: Level of anxiety will decrease Outcome: Adequate for Discharge   Problem: Elimination: Goal: Will not experience complications related to bowel motility Outcome: Adequate for Discharge Goal: Will not experience complications related to urinary retention 05/14/2021 0020 by Jenny Reichmann, RN Outcome: Adequate for Discharge 05/14/2021 0019 by Jenny Reichmann, RN Outcome: Adequate for Discharge   Problem: Pain Managment: Goal: General experience of comfort will improve 05/14/2021 0020 by Jenny Reichmann, RN Outcome: Adequate for Discharge 05/14/2021 0019 by Jenny Reichmann, RN Outcome: Adequate for Discharge   Problem: Safety: Goal: Ability to remain free from injury will improve 05/14/2021 0020 by Jenny Reichmann, RN Outcome: Adequate for Discharge 05/14/2021 0019 by Jenny Reichmann, RN Outcome: Adequate for Discharge   Problem: Skin Integrity: Goal: Risk for impaired skin integrity will decrease Outcome: Adequate for Discharge   Problem: Education: Goal: Knowledge of condition will improve 05/14/2021 0021 by Jenny Reichmann, RN Outcome: Adequate for Discharge 05/14/2021 0020 by Jenny Reichmann, RN Outcome: Adequate for Discharge 05/14/2021 0019 by Jenny Reichmann, RN Outcome: Adequate for Discharge 05/14/2021 0019 by Jenny Reichmann, RN Outcome: Adequate for Discharge Goal: Individualized Educational Video(s) Outcome: Adequate for Discharge Goal: Individualized Newborn Educational Video(s) Outcome: Adequate for Discharge   Problem: Activity: Goal: Will verbalize the importance of balancing activity with adequate  rest periods 05/14/2021 0020 by Jenny Reichmann, RN Outcome: Adequate for Discharge 05/14/2021 0019 by Jenny Reichmann, RN Outcome: Adequate for Discharge Goal: Ability to tolerate increased activity will improve Outcome: Adequate for Discharge   Problem: Coping: Goal: Ability to identify and utilize available resources and services will improve 05/14/2021 0020 by Jenny Reichmann, RN Outcome: Adequate for Discharge 05/14/2021 0019 by Jenny Reichmann, RN Outcome: Adequate for Discharge   Problem: Life Cycle: Goal: Chance of risk for complications during the postpartum period will decrease Outcome: Adequate for Discharge   Problem: Role Relationship: Goal: Ability to demonstrate positive interaction with newborn will improve 05/14/2021 0020 by Jenny Reichmann, RN Outcome: Adequate for Discharge 05/14/2021 0019 by Jenny Reichmann, RN Outcome: Adequate for Discharge   Problem: Skin Integrity: Goal: Demonstration of wound  healing without infection will improve Outcome: Adequate for Discharge   Problem: Education: Goal: Knowledge of condition will improve Outcome: Adequate for Discharge Goal: Individualized Educational Video(s) Outcome: Adequate for Discharge Goal: Individualized Newborn Educational Video(s) Outcome: Adequate for Discharge   Problem: Activity: Goal: Will verbalize the importance of balancing activity with adequate rest periods Outcome: Adequate for Discharge Goal: Ability to tolerate increased activity will improve Outcome: Adequate for Discharge   Problem: Coping: Goal: Ability to identify and utilize available resources and services will improve Outcome: Adequate for Discharge   Problem: Life Cycle: Goal: Chance of risk for complications during the postpartum period will decrease Outcome: Adequate for Discharge   Problem: Role Relationship: Goal: Ability to demonstrate positive interaction with newborn will improve Outcome: Adequate for  Discharge   Problem: Skin Integrity: Goal: Demonstration of wound healing without infection will improve Outcome: Adequate for Discharge   Problem: Activity: Goal: Will verbalize the importance of balancing activity with adequate rest periods Outcome: Adequate for Discharge

## 2021-05-14 NOTE — Progress Notes (Signed)
Rn used in house interpreter Raquel for patient education.

## 2021-05-14 NOTE — Plan of Care (Signed)
  Problem: Role Relationship: Goal: Will demonstrate positive interactions with the child 05/14/2021 0019 by Jenny Reichmann, RN Outcome: Adequate for Discharge 05/14/2021 0018 by Jenny Reichmann, RN Outcome: Adequate for Discharge   Problem: Pain Management: Goal: Relief or control of pain from uterine contractions will improve Outcome: Adequate for Discharge   Problem: Clinical Measurements: Goal: Diagnostic test results will improve Outcome: Adequate for Discharge Goal: Respiratory complications will improve Outcome: Adequate for Discharge Goal: Cardiovascular complication will be avoided Outcome: Adequate for Discharge   Problem: Activity: Goal: Risk for activity intolerance will decrease Outcome: Adequate for Discharge   Problem: Nutrition: Goal: Adequate nutrition will be maintained Outcome: Adequate for Discharge   Problem: Elimination: Goal: Will not experience complications related to urinary retention Outcome: Adequate for Discharge   Problem: Pain Managment: Goal: General experience of comfort will improve Outcome: Adequate for Discharge   Problem: Safety: Goal: Ability to remain free from injury will improve Outcome: Adequate for Discharge   Problem: Education: Goal: Knowledge of condition will improve 05/14/2021 0019 by Jenny Reichmann, RN Outcome: Adequate for Discharge 05/14/2021 0019 by Jenny Reichmann, RN Outcome: Adequate for Discharge   Problem: Activity: Goal: Will verbalize the importance of balancing activity with adequate rest periods Outcome: Adequate for Discharge   Problem: Coping: Goal: Ability to identify and utilize available resources and services will improve Outcome: Adequate for Discharge   Problem: Role Relationship: Goal: Ability to demonstrate positive interaction with newborn will improve Outcome: Adequate for Discharge

## 2021-05-14 NOTE — Plan of Care (Signed)
  Problem: Role Relationship: Goal: Will demonstrate positive interactions with the child Outcome: Adequate for Discharge   

## 2021-05-14 NOTE — Plan of Care (Signed)
  Problem: Role Relationship: Goal: Will demonstrate positive interactions with the child 05/14/2021 0019 by Jenny Reichmann, RN Outcome: Adequate for Discharge 05/14/2021 0018 by Jenny Reichmann, RN Outcome: Adequate for Discharge   Problem: Pain Management: Goal: Relief or control of pain from uterine contractions will improve Outcome: Adequate for Discharge   Problem: Clinical Measurements: Goal: Diagnostic test results will improve Outcome: Adequate for Discharge Goal: Respiratory complications will improve Outcome: Adequate for Discharge Goal: Cardiovascular complication will be avoided Outcome: Adequate for Discharge   Problem: Activity: Goal: Risk for activity intolerance will decrease Outcome: Adequate for Discharge   Problem: Nutrition: Goal: Adequate nutrition will be maintained Outcome: Adequate for Discharge   Problem: Elimination: Goal: Will not experience complications related to urinary retention Outcome: Adequate for Discharge   Problem: Pain Managment: Goal: General experience of comfort will improve Outcome: Adequate for Discharge   Problem: Education: Goal: Knowledge of condition will improve Outcome: Adequate for Discharge   Problem: Activity: Goal: Will verbalize the importance of balancing activity with adequate rest periods Outcome: Adequate for Discharge   Problem: Coping: Goal: Ability to identify and utilize available resources and services will improve Outcome: Adequate for Discharge   Problem: Role Relationship: Goal: Ability to demonstrate positive interaction with newborn will improve Outcome: Adequate for Discharge   Problem: Role Relationship: Goal: Will demonstrate positive interactions with the child 05/14/2021 0019 by Jenny Reichmann, RN Outcome: Adequate for Discharge 05/14/2021 0018 by Jenny Reichmann, RN Outcome: Adequate for Discharge   Problem: Pain Management: Goal: Relief or control of pain from uterine  contractions will improve Outcome: Adequate for Discharge   Problem: Clinical Measurements: Goal: Diagnostic test results will improve Outcome: Adequate for Discharge Goal: Respiratory complications will improve Outcome: Adequate for Discharge Goal: Cardiovascular complication will be avoided Outcome: Adequate for Discharge   Problem: Activity: Goal: Risk for activity intolerance will decrease Outcome: Adequate for Discharge   Problem: Nutrition: Goal: Adequate nutrition will be maintained Outcome: Adequate for Discharge   Problem: Elimination: Goal: Will not experience complications related to urinary retention Outcome: Adequate for Discharge   Problem: Pain Managment: Goal: General experience of comfort will improve Outcome: Adequate for Discharge   Problem: Education: Goal: Knowledge of condition will improve Outcome: Adequate for Discharge   Problem: Activity: Goal: Will verbalize the importance of balancing activity with adequate rest periods Outcome: Adequate for Discharge   Problem: Coping: Goal: Ability to identify and utilize available resources and services will improve Outcome: Adequate for Discharge   Problem: Role Relationship: Goal: Ability to demonstrate positive interaction with newborn will improve Outcome: Adequate for Discharge

## 2021-05-14 NOTE — Plan of Care (Signed)
Problem: Education: Goal: Knowledge of Childbirth will improve Outcome: Adequate for Discharge Goal: Ability to make informed decisions regarding treatment and plan of care will improve Outcome: Adequate for Discharge Goal: Ability to state and carry out methods to decrease the pain will improve Outcome: Adequate for Discharge Goal: Individualized Educational Video(s) Outcome: Adequate for Discharge   Problem: Coping: Goal: Ability to verbalize concerns and feelings about labor and delivery will improve Outcome: Adequate for Discharge   Problem: Life Cycle: Goal: Ability to make normal progression through stages of labor will improve Outcome: Adequate for Discharge Goal: Ability to effectively push during vaginal delivery will improve Outcome: Adequate for Discharge   Problem: Role Relationship: Goal: Will demonstrate positive interactions with the child 05/14/2021 0020 by Jenny Reichmann, RN Outcome: Adequate for Discharge 05/14/2021 0019 by Jenny Reichmann, RN Outcome: Adequate for Discharge 05/14/2021 0018 by Jenny Reichmann, RN Outcome: Adequate for Discharge   Problem: Safety: Goal: Risk of complications during labor and delivery will decrease Outcome: Adequate for Discharge   Problem: Pain Management: Goal: Relief or control of pain from uterine contractions will improve 05/14/2021 0020 by Jenny Reichmann, RN Outcome: Adequate for Discharge 05/14/2021 0019 by Jenny Reichmann, RN Outcome: Adequate for Discharge   Problem: Education: Goal: Knowledge of General Education information will improve Description: Including pain rating scale, medication(s)/side effects and non-pharmacologic comfort measures Outcome: Adequate for Discharge   Problem: Health Behavior/Discharge Planning: Goal: Ability to manage health-related needs will improve Outcome: Adequate for Discharge   Problem: Clinical Measurements: Goal: Ability to maintain clinical measurements within  normal limits will improve Outcome: Adequate for Discharge Goal: Will remain free from infection Outcome: Adequate for Discharge Goal: Diagnostic test results will improve 05/14/2021 0020 by Jenny Reichmann, RN Outcome: Adequate for Discharge 05/14/2021 0019 by Jenny Reichmann, RN Outcome: Adequate for Discharge Goal: Respiratory complications will improve 05/14/2021 0020 by Jenny Reichmann, RN Outcome: Adequate for Discharge 05/14/2021 0019 by Jenny Reichmann, RN Outcome: Adequate for Discharge Goal: Cardiovascular complication will be avoided 05/14/2021 0020 by Jenny Reichmann, RN Outcome: Adequate for Discharge 05/14/2021 0019 by Jenny Reichmann, RN Outcome: Adequate for Discharge   Problem: Activity: Goal: Risk for activity intolerance will decrease 05/14/2021 0020 by Jenny Reichmann, RN Outcome: Adequate for Discharge 05/14/2021 0019 by Jenny Reichmann, RN Outcome: Adequate for Discharge   Problem: Nutrition: Goal: Adequate nutrition will be maintained 05/14/2021 0020 by Jenny Reichmann, RN Outcome: Adequate for Discharge 05/14/2021 0019 by Jenny Reichmann, RN Outcome: Adequate for Discharge   Problem: Coping: Goal: Level of anxiety will decrease Outcome: Adequate for Discharge   Problem: Elimination: Goal: Will not experience complications related to bowel motility Outcome: Adequate for Discharge Goal: Will not experience complications related to urinary retention 05/14/2021 0020 by Jenny Reichmann, RN Outcome: Adequate for Discharge 05/14/2021 0019 by Jenny Reichmann, RN Outcome: Adequate for Discharge   Problem: Pain Managment: Goal: General experience of comfort will improve 05/14/2021 0020 by Jenny Reichmann, RN Outcome: Adequate for Discharge 05/14/2021 0019 by Jenny Reichmann, RN Outcome: Adequate for Discharge   Problem: Safety: Goal: Ability to remain free from injury will improve 05/14/2021 0020 by Jenny Reichmann, RN Outcome:  Adequate for Discharge 05/14/2021 0019 by Jenny Reichmann, RN Outcome: Adequate for Discharge   Problem: Skin Integrity: Goal: Risk for impaired skin integrity will decrease Outcome: Adequate for Discharge   Problem: Education: Goal: Knowledge of condition will improve 05/14/2021 0020 by  Jenny Reichmann, RN Outcome: Adequate for Discharge 05/14/2021 0019 by Jenny Reichmann, RN Outcome: Adequate for Discharge 05/14/2021 0019 by Jenny Reichmann, RN Outcome: Adequate for Discharge Goal: Individualized Educational Video(s) Outcome: Adequate for Discharge Goal: Individualized Newborn Educational Video(s) Outcome: Adequate for Discharge   Problem: Activity: Goal: Will verbalize the importance of balancing activity with adequate rest periods 05/14/2021 0020 by Jenny Reichmann, RN Outcome: Adequate for Discharge 05/14/2021 0019 by Jenny Reichmann, RN Outcome: Adequate for Discharge Goal: Ability to tolerate increased activity will improve Outcome: Adequate for Discharge   Problem: Coping: Goal: Ability to identify and utilize available resources and services will improve 05/14/2021 0020 by Jenny Reichmann, RN Outcome: Adequate for Discharge 05/14/2021 0019 by Jenny Reichmann, RN Outcome: Adequate for Discharge   Problem: Life Cycle: Goal: Chance of risk for complications during the postpartum period will decrease Outcome: Adequate for Discharge   Problem: Role Relationship: Goal: Ability to demonstrate positive interaction with newborn will improve 05/14/2021 0020 by Jenny Reichmann, RN Outcome: Adequate for Discharge 05/14/2021 0019 by Jenny Reichmann, RN Outcome: Adequate for Discharge   Problem: Skin Integrity: Goal: Demonstration of wound healing without infection will improve Outcome: Adequate for Discharge   Problem: Education: Goal: Knowledge of condition will improve Outcome: Adequate for Discharge Goal: Individualized Educational Video(s) Outcome:  Adequate for Discharge Goal: Individualized Newborn Educational Video(s) Outcome: Adequate for Discharge   Problem: Activity: Goal: Will verbalize the importance of balancing activity with adequate rest periods Outcome: Adequate for Discharge Goal: Ability to tolerate increased activity will improve Outcome: Adequate for Discharge   Problem: Coping: Goal: Ability to identify and utilize available resources and services will improve Outcome: Adequate for Discharge   Problem: Life Cycle: Goal: Chance of risk for complications during the postpartum period will decrease Outcome: Adequate for Discharge   Problem: Role Relationship: Goal: Ability to demonstrate positive interaction with newborn will improve Outcome: Adequate for Discharge   Problem: Skin Integrity: Goal: Demonstration of wound healing without infection will improve Outcome: Adequate for Discharge   Problem: Education: Goal: Knowledge of Childbirth will improve Outcome: Adequate for Discharge Goal: Ability to make informed decisions regarding treatment and plan of care will improve Outcome: Adequate for Discharge Goal: Ability to state and carry out methods to decrease the pain will improve Outcome: Adequate for Discharge Goal: Individualized Educational Video(s) Outcome: Adequate for Discharge   Problem: Coping: Goal: Ability to verbalize concerns and feelings about labor and delivery will improve Outcome: Adequate for Discharge   Problem: Life Cycle: Goal: Ability to make normal progression through stages of labor will improve Outcome: Adequate for Discharge Goal: Ability to effectively push during vaginal delivery will improve Outcome: Adequate for Discharge   Problem: Role Relationship: Goal: Will demonstrate positive interactions with the child 05/14/2021 0020 by Jenny Reichmann, RN Outcome: Adequate for Discharge 05/14/2021 0019 by Jenny Reichmann, RN Outcome: Adequate for Discharge 05/14/2021 0018  by Jenny Reichmann, RN Outcome: Adequate for Discharge   Problem: Safety: Goal: Risk of complications during labor and delivery will decrease Outcome: Adequate for Discharge   Problem: Pain Management: Goal: Relief or control of pain from uterine contractions will improve 05/14/2021 0020 by Jenny Reichmann, RN Outcome: Adequate for Discharge 05/14/2021 0019 by Jenny Reichmann, RN Outcome: Adequate for Discharge   Problem: Education: Goal: Knowledge of General Education information will improve Description: Including pain rating scale, medication(s)/side effects and non-pharmacologic comfort measures Outcome: Adequate for Discharge   Problem:  Health Behavior/Discharge Planning: Goal: Ability to manage health-related needs will improve Outcome: Adequate for Discharge   Problem: Clinical Measurements: Goal: Ability to maintain clinical measurements within normal limits will improve Outcome: Adequate for Discharge Goal: Will remain free from infection Outcome: Adequate for Discharge Goal: Diagnostic test results will improve 05/14/2021 0020 by Jenny Reichmann, RN Outcome: Adequate for Discharge 05/14/2021 0019 by Jenny Reichmann, RN Outcome: Adequate for Discharge Goal: Respiratory complications will improve 05/14/2021 0020 by Jenny Reichmann, RN Outcome: Adequate for Discharge 05/14/2021 0019 by Jenny Reichmann, RN Outcome: Adequate for Discharge Goal: Cardiovascular complication will be avoided 05/14/2021 0020 by Jenny Reichmann, RN Outcome: Adequate for Discharge 05/14/2021 0019 by Jenny Reichmann, RN Outcome: Adequate for Discharge   Problem: Activity: Goal: Risk for activity intolerance will decrease 05/14/2021 0020 by Jenny Reichmann, RN Outcome: Adequate for Discharge 05/14/2021 0019 by Jenny Reichmann, RN Outcome: Adequate for Discharge   Problem: Nutrition: Goal: Adequate nutrition will be maintained 05/14/2021 0020 by Jenny Reichmann,  RN Outcome: Adequate for Discharge 05/14/2021 0019 by Jenny Reichmann, RN Outcome: Adequate for Discharge   Problem: Coping: Goal: Level of anxiety will decrease Outcome: Adequate for Discharge   Problem: Elimination: Goal: Will not experience complications related to bowel motility Outcome: Adequate for Discharge Goal: Will not experience complications related to urinary retention 05/14/2021 0020 by Jenny Reichmann, RN Outcome: Adequate for Discharge 05/14/2021 0019 by Jenny Reichmann, RN Outcome: Adequate for Discharge   Problem: Pain Managment: Goal: General experience of comfort will improve 05/14/2021 0020 by Jenny Reichmann, RN Outcome: Adequate for Discharge 05/14/2021 0019 by Jenny Reichmann, RN Outcome: Adequate for Discharge   Problem: Safety: Goal: Ability to remain free from injury will improve 05/14/2021 0020 by Jenny Reichmann, RN Outcome: Adequate for Discharge 05/14/2021 0019 by Jenny Reichmann, RN Outcome: Adequate for Discharge   Problem: Skin Integrity: Goal: Risk for impaired skin integrity will decrease Outcome: Adequate for Discharge   Problem: Education: Goal: Knowledge of condition will improve 05/14/2021 0020 by Jenny Reichmann, RN Outcome: Adequate for Discharge 05/14/2021 0019 by Jenny Reichmann, RN Outcome: Adequate for Discharge 05/14/2021 0019 by Jenny Reichmann, RN Outcome: Adequate for Discharge Goal: Individualized Educational Video(s) Outcome: Adequate for Discharge Goal: Individualized Newborn Educational Video(s) Outcome: Adequate for Discharge   Problem: Activity: Goal: Will verbalize the importance of balancing activity with adequate rest periods 05/14/2021 0020 by Jenny Reichmann, RN Outcome: Adequate for Discharge 05/14/2021 0019 by Jenny Reichmann, RN Outcome: Adequate for Discharge Goal: Ability to tolerate increased activity will improve Outcome: Adequate for Discharge   Problem: Coping: Goal: Ability to  identify and utilize available resources and services will improve 05/14/2021 0020 by Jenny Reichmann, RN Outcome: Adequate for Discharge 05/14/2021 0019 by Jenny Reichmann, RN Outcome: Adequate for Discharge   Problem: Life Cycle: Goal: Chance of risk for complications during the postpartum period will decrease Outcome: Adequate for Discharge   Problem: Role Relationship: Goal: Ability to demonstrate positive interaction with newborn will improve 05/14/2021 0020 by Jenny Reichmann, RN Outcome: Adequate for Discharge 05/14/2021 0019 by Jenny Reichmann, RN Outcome: Adequate for Discharge   Problem: Skin Integrity: Goal: Demonstration of wound healing without infection will improve Outcome: Adequate for Discharge   Problem: Education: Goal: Knowledge of condition will improve Outcome: Adequate for Discharge Goal: Individualized Educational Video(s) Outcome: Adequate for Discharge Goal: Individualized Newborn Educational Video(s) Outcome: Adequate for Discharge  Problem: Activity: Goal: Will verbalize the importance of balancing activity with adequate rest periods Outcome: Adequate for Discharge Goal: Ability to tolerate increased activity will improve Outcome: Adequate for Discharge   Problem: Coping: Goal: Ability to identify and utilize available resources and services will improve Outcome: Adequate for Discharge   Problem: Life Cycle: Goal: Chance of risk for complications during the postpartum period will decrease Outcome: Adequate for Discharge   Problem: Role Relationship: Goal: Ability to demonstrate positive interaction with newborn will improve Outcome: Adequate for Discharge   Problem: Skin Integrity: Goal: Demonstration of wound healing without infection will improve Outcome: Adequate for Discharge

## 2021-05-14 NOTE — Plan of Care (Signed)
Problem: Education: Goal: Knowledge of Childbirth will improve Outcome: Adequate for Discharge Goal: Ability to make informed decisions regarding treatment and plan of care will improve Outcome: Adequate for Discharge Goal: Ability to state and carry out methods to decrease the pain will improve Outcome: Adequate for Discharge Goal: Individualized Educational Video(s) Outcome: Adequate for Discharge   Problem: Coping: Goal: Ability to verbalize concerns and feelings about labor and delivery will improve Outcome: Adequate for Discharge   Problem: Life Cycle: Goal: Ability to make normal progression through stages of labor will improve Outcome: Adequate for Discharge Goal: Ability to effectively push during vaginal delivery will improve Outcome: Adequate for Discharge   Problem: Role Relationship: Goal: Will demonstrate positive interactions with the child 05/14/2021 0020 by Jenny Reichmann, RN Outcome: Adequate for Discharge 05/14/2021 0019 by Jenny Reichmann, RN Outcome: Adequate for Discharge 05/14/2021 0018 by Jenny Reichmann, RN Outcome: Adequate for Discharge   Problem: Safety: Goal: Risk of complications during labor and delivery will decrease Outcome: Adequate for Discharge   Problem: Pain Management: Goal: Relief or control of pain from uterine contractions will improve 05/14/2021 0020 by Jenny Reichmann, RN Outcome: Adequate for Discharge 05/14/2021 0019 by Jenny Reichmann, RN Outcome: Adequate for Discharge   Problem: Education: Goal: Knowledge of General Education information will improve Description: Including pain rating scale, medication(s)/side effects and non-pharmacologic comfort measures Outcome: Adequate for Discharge   Problem: Health Behavior/Discharge Planning: Goal: Ability to manage health-related needs will improve Outcome: Adequate for Discharge   Problem: Clinical Measurements: Goal: Ability to maintain clinical measurements within  normal limits will improve Outcome: Adequate for Discharge Goal: Will remain free from infection Outcome: Adequate for Discharge Goal: Diagnostic test results will improve 05/14/2021 0020 by Jenny Reichmann, RN Outcome: Adequate for Discharge 05/14/2021 0019 by Jenny Reichmann, RN Outcome: Adequate for Discharge Goal: Respiratory complications will improve 05/14/2021 0020 by Jenny Reichmann, RN Outcome: Adequate for Discharge 05/14/2021 0019 by Jenny Reichmann, RN Outcome: Adequate for Discharge Goal: Cardiovascular complication will be avoided 05/14/2021 0020 by Jenny Reichmann, RN Outcome: Adequate for Discharge 05/14/2021 0019 by Jenny Reichmann, RN Outcome: Adequate for Discharge   Problem: Activity: Goal: Risk for activity intolerance will decrease 05/14/2021 0020 by Jenny Reichmann, RN Outcome: Adequate for Discharge 05/14/2021 0019 by Jenny Reichmann, RN Outcome: Adequate for Discharge   Problem: Nutrition: Goal: Adequate nutrition will be maintained 05/14/2021 0020 by Jenny Reichmann, RN Outcome: Adequate for Discharge 05/14/2021 0019 by Jenny Reichmann, RN Outcome: Adequate for Discharge   Problem: Coping: Goal: Level of anxiety will decrease Outcome: Adequate for Discharge   Problem: Elimination: Goal: Will not experience complications related to bowel motility Outcome: Adequate for Discharge Goal: Will not experience complications related to urinary retention 05/14/2021 0020 by Jenny Reichmann, RN Outcome: Adequate for Discharge 05/14/2021 0019 by Jenny Reichmann, RN Outcome: Adequate for Discharge   Problem: Pain Managment: Goal: General experience of comfort will improve 05/14/2021 0020 by Jenny Reichmann, RN Outcome: Adequate for Discharge 05/14/2021 0019 by Jenny Reichmann, RN Outcome: Adequate for Discharge   Problem: Safety: Goal: Ability to remain free from injury will improve 05/14/2021 0020 by Jenny Reichmann, RN Outcome:  Adequate for Discharge 05/14/2021 0019 by Jenny Reichmann, RN Outcome: Adequate for Discharge   Problem: Skin Integrity: Goal: Risk for impaired skin integrity will decrease Outcome: Adequate for Discharge   Problem: Education: Goal: Knowledge of condition will improve 05/14/2021 0021 by  Jenny Reichmann, RN Outcome: Adequate for Discharge 05/14/2021 0020 by Jenny Reichmann, RN Outcome: Adequate for Discharge 05/14/2021 0019 by Jenny Reichmann, RN Outcome: Adequate for Discharge 05/14/2021 0019 by Jenny Reichmann, RN Outcome: Adequate for Discharge Goal: Individualized Educational Video(s) Outcome: Adequate for Discharge Goal: Individualized Newborn Educational Video(s) Outcome: Adequate for Discharge   Problem: Activity: Goal: Will verbalize the importance of balancing activity with adequate rest periods 05/14/2021 0020 by Jenny Reichmann, RN Outcome: Adequate for Discharge 05/14/2021 0019 by Jenny Reichmann, RN Outcome: Adequate for Discharge Goal: Ability to tolerate increased activity will improve Outcome: Adequate for Discharge   Problem: Coping: Goal: Ability to identify and utilize available resources and services will improve 05/14/2021 0020 by Jenny Reichmann, RN Outcome: Adequate for Discharge 05/14/2021 0019 by Jenny Reichmann, RN Outcome: Adequate for Discharge   Problem: Life Cycle: Goal: Chance of risk for complications during the postpartum period will decrease Outcome: Adequate for Discharge   Problem: Role Relationship: Goal: Ability to demonstrate positive interaction with newborn will improve 05/14/2021 0020 by Jenny Reichmann, RN Outcome: Adequate for Discharge 05/14/2021 0019 by Jenny Reichmann, RN Outcome: Adequate for Discharge   Problem: Skin Integrity: Goal: Demonstration of wound healing without infection will improve Outcome: Adequate for Discharge   Problem: Education: Goal: Knowledge of condition will improve Outcome:  Adequate for Discharge Goal: Individualized Educational Video(s) Outcome: Adequate for Discharge Goal: Individualized Newborn Educational Video(s) Outcome: Adequate for Discharge   Problem: Activity: Goal: Will verbalize the importance of balancing activity with adequate rest periods Outcome: Adequate for Discharge Goal: Ability to tolerate increased activity will improve Outcome: Adequate for Discharge   Problem: Coping: Goal: Ability to identify and utilize available resources and services will improve Outcome: Adequate for Discharge   Problem: Life Cycle: Goal: Chance of risk for complications during the postpartum period will decrease Outcome: Adequate for Discharge   Problem: Role Relationship: Goal: Ability to demonstrate positive interaction with newborn will improve Outcome: Adequate for Discharge   Problem: Skin Integrity: Goal: Demonstration of wound healing without infection will improve Outcome: Adequate for Discharge

## 2021-05-14 NOTE — Progress Notes (Signed)
Rn used the [employer] interpreter [phone] .

## 2021-05-14 NOTE — Plan of Care (Signed)
  Problem: Education: Goal: Knowledge of Childbirth will improve Outcome: Adequate for Discharge Goal: Ability to make informed decisions regarding treatment and plan of care will improve Outcome: Adequate for Discharge Goal: Ability to state and carry out methods to decrease the pain will improve Outcome: Adequate for Discharge Goal: Individualized Educational Video(s) Outcome: Adequate for Discharge   Problem: Coping: Goal: Ability to verbalize concerns and feelings about labor and delivery will improve Outcome: Adequate for Discharge   Problem: Life Cycle: Goal: Ability to make normal progression through stages of labor will improve Outcome: Adequate for Discharge Goal: Ability to effectively push during vaginal delivery will improve Outcome: Adequate for Discharge   Problem: Health Behavior/Discharge Planning: Goal: Ability to manage health-related needs will improve Outcome: Adequate for Discharge   Problem: Coping: Goal: Level of anxiety will decrease Outcome: Adequate for Discharge   Problem: Elimination: Goal: Will not experience complications related to bowel motility Outcome: Adequate for Discharge   Problem: Education: Goal: Individualized Educational Video(s) Outcome: Adequate for Discharge Goal: Individualized Newborn Educational Video(s) Outcome: Adequate for Discharge   Problem: Coping: Goal: Ability to identify and utilize available resources and services will improve 05/14/2021 0020 by Jenny Reichmann, RN Outcome: Adequate for Discharge 05/14/2021 0019 by Jenny Reichmann, RN Outcome: Adequate for Discharge   Problem: Skin Integrity: Goal: Demonstration of wound healing without infection will improve Outcome: Adequate for Discharge   Problem: Education: Goal: Individualized Newborn Educational Video(s) Outcome: Adequate for Discharge   Problem: Coping: Goal: Ability to identify and utilize available resources and services will improve Outcome:  Adequate for Discharge   Problem: Life Cycle: Goal: Chance of risk for complications during the postpartum period will decrease Outcome: Adequate for Discharge   Problem: Role Relationship: Goal: Ability to demonstrate positive interaction with newborn will improve Outcome: Adequate for Discharge   Problem: Skin Integrity: Goal: Demonstration of wound healing without infection will improve Outcome: Adequate for Discharge   Problem: Activity: Goal: Will verbalize the importance of balancing activity with adequate rest periods Outcome: Adequate for Discharge

## 2021-05-15 MED ORDER — IBUPROFEN 600 MG PO TABS: 600.0000 mg | ORAL_TABLET | Freq: Four times a day (QID) | ORAL | 0 refills | Status: AC

## 2021-05-15 MED ORDER — OXYCODONE HCL 5 MG PO TABS: 5.0000 mg | ORAL_TABLET | Freq: Four times a day (QID) | ORAL | 0 refills | Status: AC | PRN

## 2021-05-15 NOTE — Progress Notes (Signed)
Discharge instructions reviewed with mother with [facility] spanish interpreter, Jesus [phone], all questions and follow up care reviewed. Pt states she understands.

## 2021-05-15 NOTE — Discharge Summary (Signed)
Postpartum Discharge Summary       Patient Name: Judith Levine DOB: 1980/12/19 MRN: [id number]  Date of admission: 05/11/2021 Delivery date:05/12/2021  Delivering provider: Eyvonne Mechanic A  Date of discharge: 05/15/2021  Admitting diagnosis: Pregnancy [Z34.90] Intrauterine pregnancy: [redacted]w[redacted]d    Secondary diagnosis:  Active Problems:   Pregnancy  Additional problems: IUGR, AMA    Discharge diagnosis: Term Pregnancy Delivered                                              Post partum procedures:   Augmentation: AROM, Pitocin, and Cytotec Complications: None  Hospital course: Induction of Labor With Cesarean Section   40y.o. yo G2P1011 at 38w1das admitted to the hospital 05/11/2021 for induction of labor. Patient had a labor course significant for fetal distress. The patient went for cesarean section due to Non-Reassuring FHR. Delivery details are as follows: Membrane Rupture Time/Date: 11:47 AM ,05/12/2021   Delivery Method:C-Section, Vacuum Assisted  Details of operation can be found in separate operative Note.  Patient had an uncomplicated postpartum course. She is ambulating, tolerating a regular diet, passing flatus, and urinating well.  Patient is discharged home in stable condition on 05/15/21.      Newborn Data: Birth date:05/12/2021  Birth time:6:15 PM  Gender:Female  Living status:Living  Apgars:5 ,9  Weight:2750 g                                Magnesium Sulfate received: No BMZ received: No Rhophylac:N/A MMR:N/A T-DaP:Given prenatally Flu: Yes Transfusion:No  Physical exam  Vitals:   05/14/21 0454 05/14/21 1200 05/14/21 2053 05/15/21 0453  BP: 94/64 123/82 (!) 127/59 105/66  Pulse: 75 82 69 70  Resp: 18 19 18 18   Temp: 98.3 F (36.8 C) 98.2 F (36.8 C) 97.7 F (36.5 C) 97.6 F (36.4 C)  TempSrc: Oral Oral Oral Oral  SpO2:  100% 99%   Weight:      Height:       General: alert, cooperative, and no distress Lochia: appropriate Uterine Fundus:  firm Incision: Healing well with no significant drainage DVT Evaluation: No evidence of DVT seen on physical exam. Labs: Lab Results  Component Value Date   WBC 17.6 (H) 05/13/2021   HGB 9.2 (L) 05/13/2021   HCT 27.9 (L) 05/13/2021   MCV 84.5 05/13/2021   PLT 235 05/13/2021   CMP Latest Ref Rng & Units 08/17/2020  Glucose 70 - 99 mg/dL 91  BUN 6 - 20 mg/dL 10  Creatinine 0.44 - 1.00 mg/dL 0.53  Sodium 135 - 145 mmol/L 135  Potassium 3.5 - 5.1 mmol/L 3.7  Chloride 98 - 111 mmol/L 101  CO2 22 - 32 mmol/L 23  Calcium 8.9 - 10.3 mg/dL 9.0  Total Protein 6.5 - 8.1 g/dL 7.2  Total Bilirubin 0.3 - 1.2 mg/dL 1.8(H)  Alkaline Phos 38 - 126 U/L 48  AST 15 - 41 U/L 25  ALT 0 - 44 U/L 19   Edinburgh Score: Edinburgh Postnatal Depression Scale Screening Tool 05/13/2021  I have been able to laugh and see the funny side of things. 0  I have looked forward with enjoyment to things. 0  I have blamed myself unnecessarily when things went wrong. 0  I have been anxious or  worried for no good reason. 0  I have felt scared or panicky for no good reason. 0  Things have been getting on top of me. 1  I have been so unhappy that I have had difficulty sleeping. 0  I have felt sad or miserable. 0  I have been so unhappy that I have been crying. 0  The thought of harming myself has occurred to me. 0  Edinburgh Postnatal Depression Scale Total 1      After visit meds:  Allergies as of 05/15/2021       Reactions   Shellfish Allergy         Medication List     STOP taking these medications    aspirin 325 MG tablet   Dotti 0.1 MG/24HR patch Generic drug: estradiol   letrozole 2.5 MG tablet Commonly known as: FEMARA   Lupron Depot (73-Month) 3.75 MG injection Generic drug: leuprolide   ondansetron 4 MG disintegrating tablet Commonly known as: Zofran ODT   pantoprazole 20 MG tablet Commonly known as: PROTONIX       TAKE these medications    ibuprofen 600 MG tablet Commonly  known as: ADVIL Take 1 tablet (600 mg total) by mouth every 6 (six) hours.   oxyCODONE 5 MG immediate release tablet Commonly known as: Oxy IR/ROXICODONE Take 1-2 tablets (5-10 mg total) by mouth every 6 (six) hours as needed for moderate pain or severe pain.   prenatal multivitamin Tabs tablet Take 1 tablet by mouth daily at 12 noon.         Discharge home in stable condition Infant Feeding: Breast Infant Disposition:home with mother Discharge instruction: per After Visit Summary and Postpartum booklet. Activity: Advance as tolerated. Pelvic rest for 6 weeks.  Diet: routine diet Anticipated Birth Control: Unsure Postpartum Appointment:6 weeks Additional Postpartum F/U:    Future Appointments:No future appointments. Follow up Visit:      05/15/2021 Luz Lex, MD

## 2021-05-16 LAB — SURGICAL PATHOLOGY

## 2021-09-01 IMAGING — MG DIGITAL DIAGNOSTIC BILAT W/ TOMO W/ CAD
6 of 10 series · 6 of 30 positions shown · non-contrast
Comparison: None.

CLINICAL DATA: 38-year-old female presenting for evaluation of a
palpable lump in the right breast at 9 o'clock.

EXAM:
DIGITAL DIAGNOSTIC BILATERAL MAMMOGRAM WITH CAD AND TOMO
ULTRASOUND RIGHT BREAST

[L CC synth-2D]
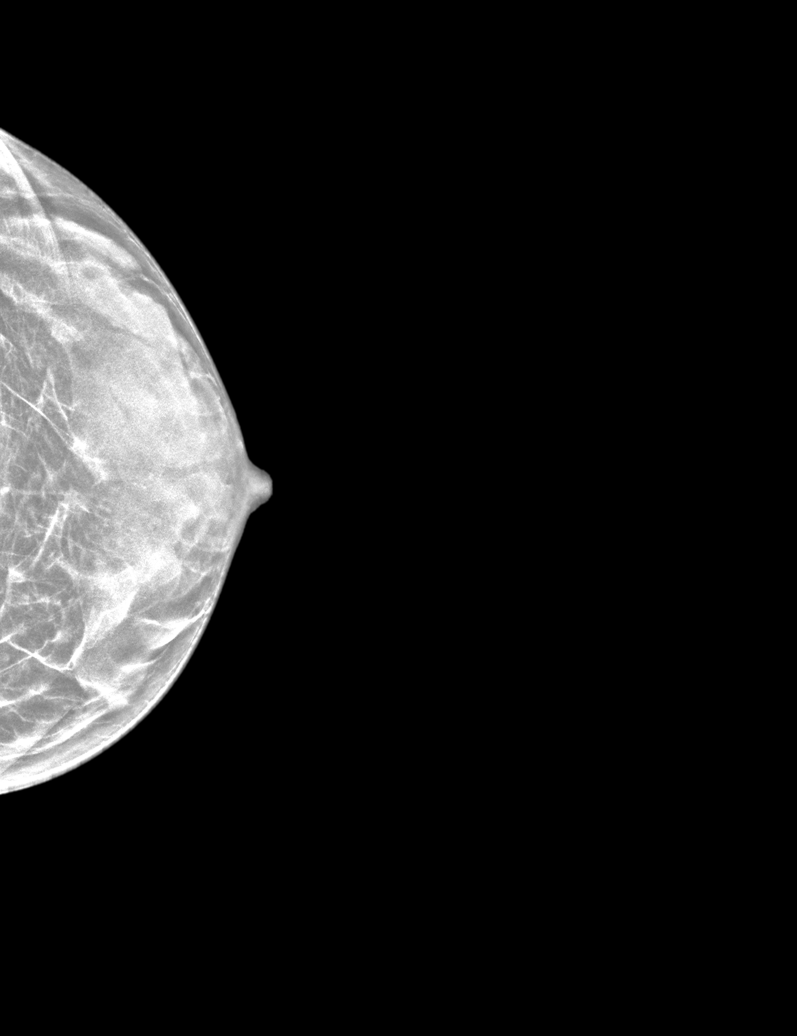

[R TAN synth-2D]
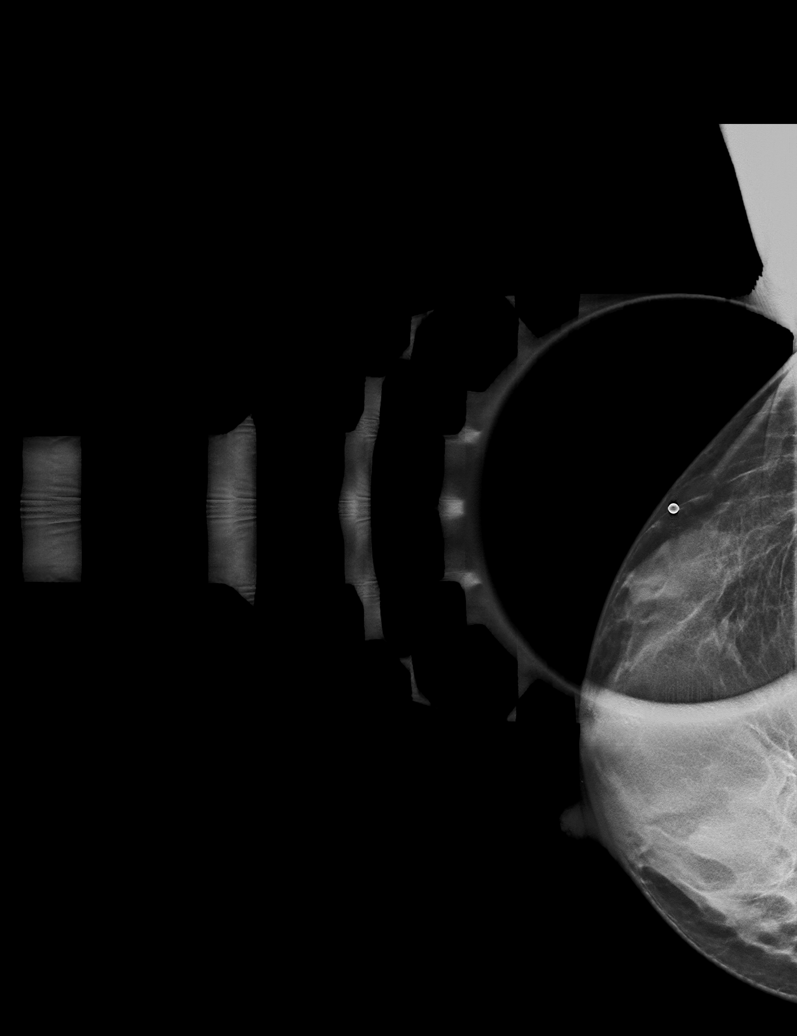

[R MLO synth-2D]
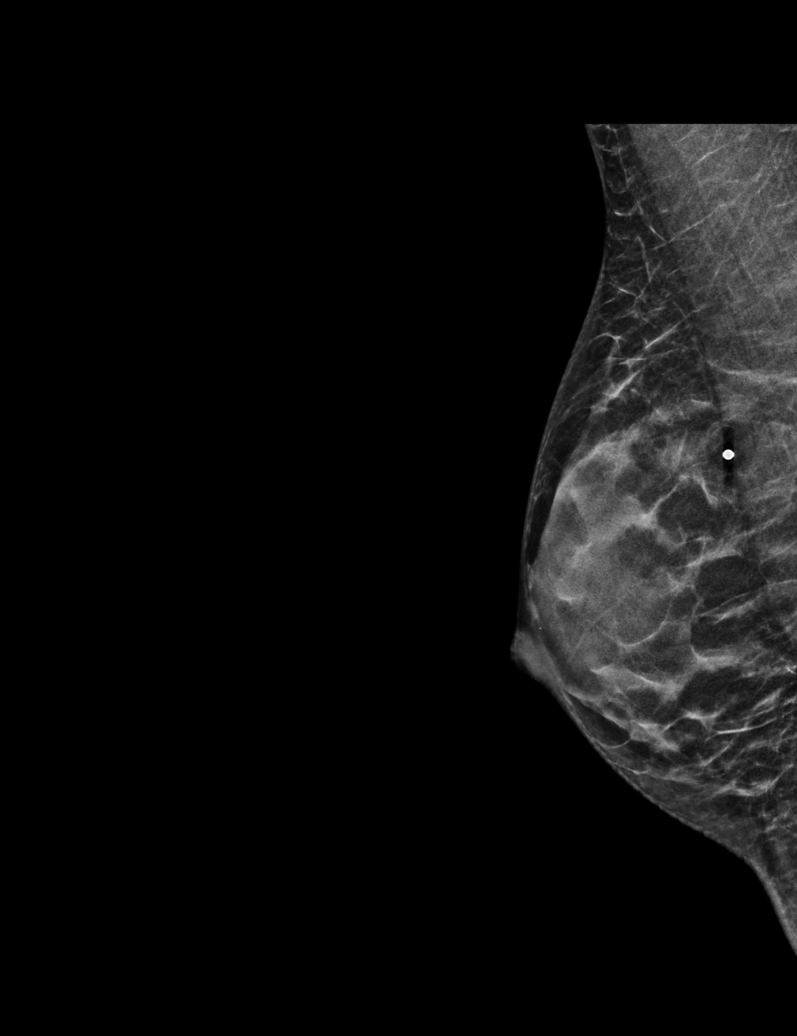

[L MLO synth-2D]
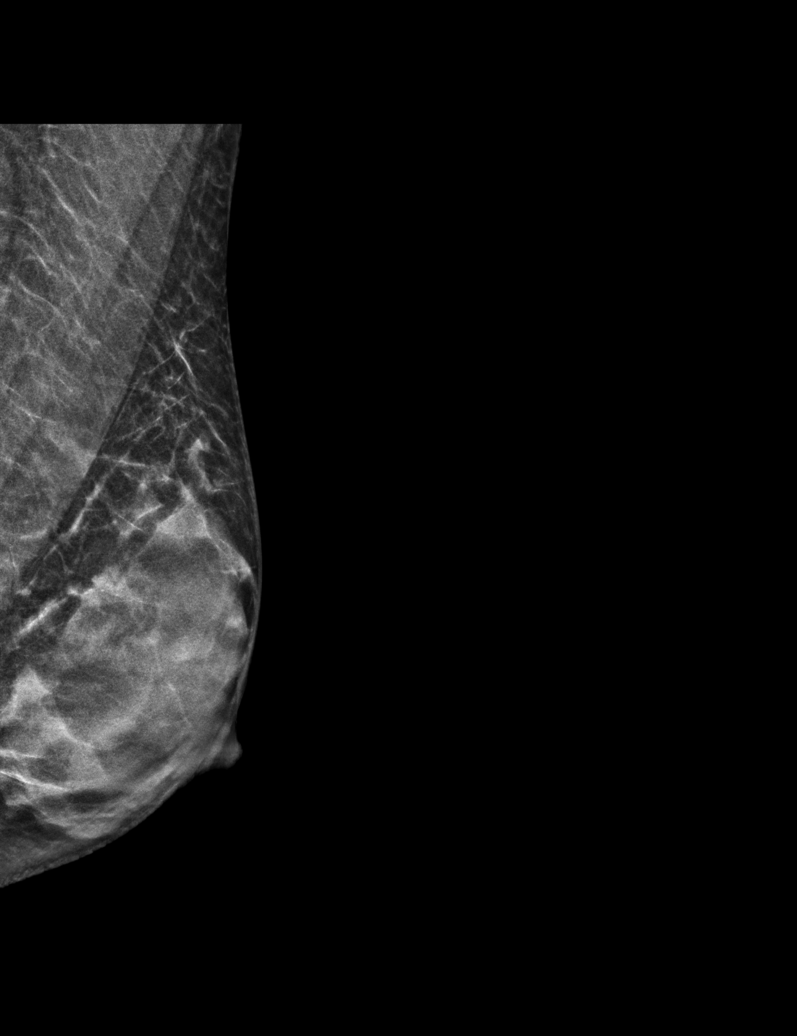

[R CC synth-2D]
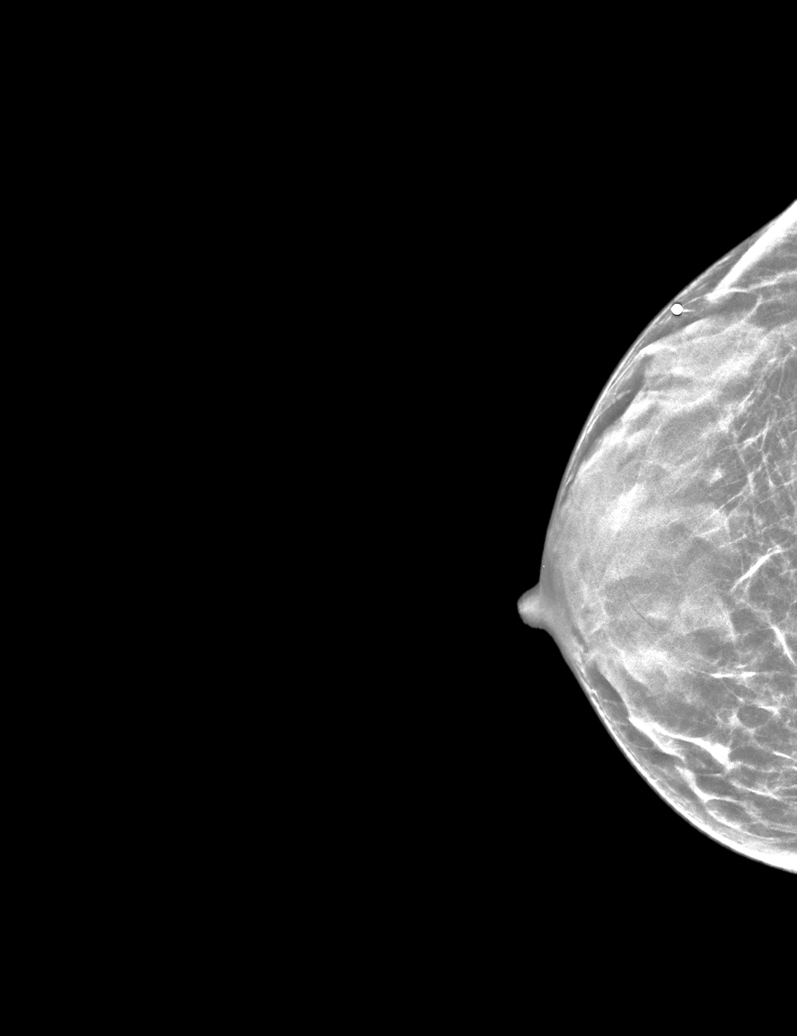

[R CC tomo · tomo slice 21/40.0]
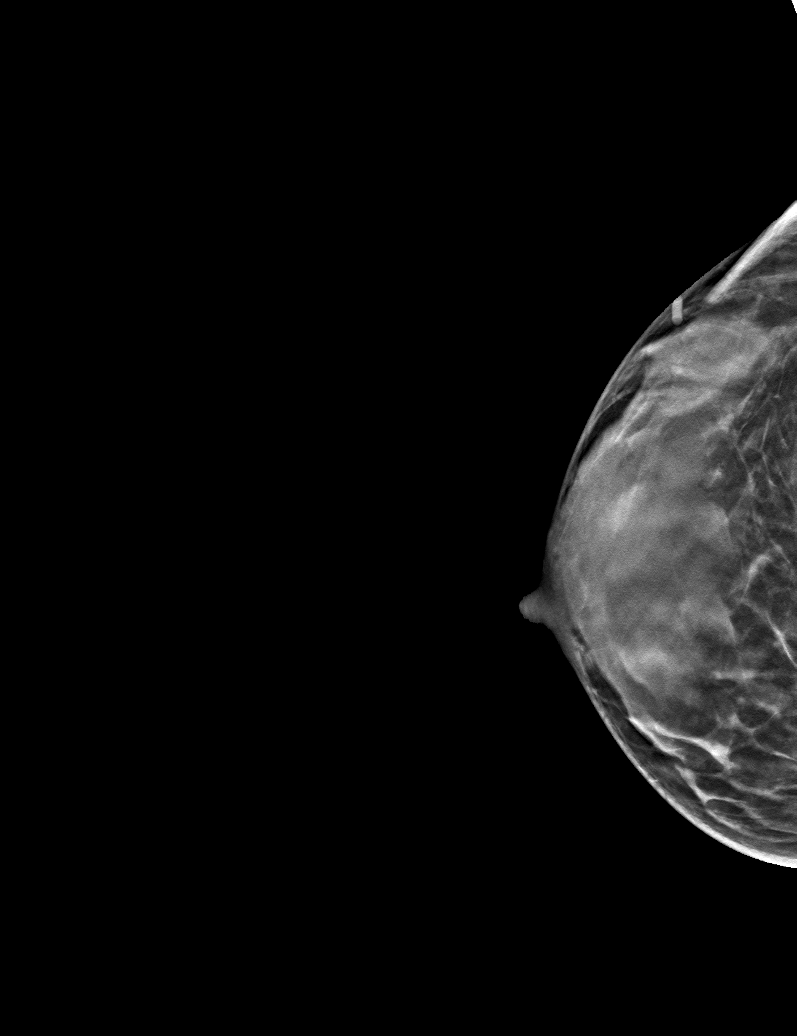

[6 of 30 positions shown; findings below may reference images not displayed]

ACR Breast Density Category d: The breast tissue is extremely dense,
which lowers the sensitivity of mammography.
FINDINGS: A BB indicating the palpable site of concern has been placed along
the lateral aspect of the right breast. There is an asymmetry deep
to the palpable marker on the CC view, without definite underlying
mass. No other suspicious calcifications, masses or areas of
distortion are seen in the bilateral breasts.

Mammographic images were processed with CAD.

On physical exam, there is a mobile firm ridge of tissue at the
palpable site in the upper-outer right breast.

Ultrasound targeted to the upper-outer right breast demonstrates
normal dense fibroglandular tissue with scattered areas of
fibrocystic change. No suspicious masses or areas of shadowing are
identified.
IMPRESSION: 1. No suspicious mammographic or targeted sonographic abnormalities
are identified at the palpable site in the upper-outer right breast.

2.  No mammographic evidence of malignancy in the bilateral breasts.

RECOMMENDATION:
1. Clinical follow-up recommended for the palpable area of concern
in the upper-outer right breast breast. Any further workup should be
based on clinical grounds.

2. Screening mammogram at age 40 unless there are persistent or
intervening clinical concerns. (Code:[id number])

I have discussed the findings and recommendations with the patient.
If applicable, a reminder letter will be sent to the patient
regarding the next appointment.

BI-RADS CATEGORY  1: Negative.
